# Patient Record
Sex: Female | Born: 1940 | Race: White | Hispanic: No | Smoking: Former smoker
Health system: Southern US, Community
[De-identification: ages and names within clinical notes are randomized; demographics above are authoritative.]

## PROBLEM LIST (undated history)

## (undated) DIAGNOSIS — E7800 Pure hypercholesterolemia, unspecified: Secondary | ICD-10-CM

## (undated) DIAGNOSIS — E039 Hypothyroidism, unspecified: Secondary | ICD-10-CM

## (undated) DIAGNOSIS — N029 Recurrent and persistent hematuria with unspecified morphologic changes: Secondary | ICD-10-CM

## (undated) DIAGNOSIS — M1990 Unspecified osteoarthritis, unspecified site: Secondary | ICD-10-CM

## (undated) DIAGNOSIS — R55 Syncope and collapse: Secondary | ICD-10-CM

## (undated) DIAGNOSIS — I10 Essential (primary) hypertension: Secondary | ICD-10-CM

## (undated) DIAGNOSIS — R011 Cardiac murmur, unspecified: Secondary | ICD-10-CM

## (undated) DIAGNOSIS — K219 Gastro-esophageal reflux disease without esophagitis: Secondary | ICD-10-CM

## (undated) HISTORY — PX: TENDON RELEASE: SHX230

## (undated) HISTORY — DX: Gastro-esophageal reflux disease without esophagitis: K21.9

## (undated) HISTORY — DX: Cardiac murmur, unspecified: R01.1

## (undated) HISTORY — DX: Essential (primary) hypertension: I10

## (undated) HISTORY — DX: Hypothyroidism, unspecified: E03.9

## (undated) HISTORY — DX: Pure hypercholesterolemia, unspecified: E78.00

## (undated) HISTORY — DX: Recurrent and persistent hematuria with unspecified morphologic changes: N02.9

## (undated) HISTORY — DX: Unspecified osteoarthritis, unspecified site: M19.90

## (undated) HISTORY — DX: Syncope and collapse: R55

---
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
## 2000-12-28 ENCOUNTER — Other Ambulatory Visit: Admission: RE | Admit: 2000-12-28 | Discharge: 2000-12-28 | Payer: Self-pay | Admitting: Gynecology

## 2001-10-31 ENCOUNTER — Encounter: Admission: RE | Admit: 2001-10-31 | Discharge: 2001-10-31 | Payer: Self-pay | Admitting: Gynecology

## 2001-10-31 ENCOUNTER — Encounter: Payer: Self-pay | Admitting: Gynecology

## 2001-12-30 ENCOUNTER — Other Ambulatory Visit: Admission: RE | Admit: 2001-12-30 | Discharge: 2001-12-30 | Payer: Self-pay | Admitting: Gynecology

## 2002-01-17 ENCOUNTER — Encounter: Payer: Self-pay | Admitting: Family Medicine

## 2002-01-17 ENCOUNTER — Encounter: Admission: RE | Admit: 2002-01-17 | Discharge: 2002-01-17 | Payer: Self-pay | Admitting: Family Medicine

## 2003-01-13 ENCOUNTER — Other Ambulatory Visit: Admission: RE | Admit: 2003-01-13 | Discharge: 2003-01-13 | Payer: Self-pay | Admitting: Gynecology

## 2004-02-03 ENCOUNTER — Other Ambulatory Visit: Admission: RE | Admit: 2004-02-03 | Discharge: 2004-02-03 | Payer: Self-pay | Admitting: Gynecology

## 2004-03-22 ENCOUNTER — Ambulatory Visit (HOSPITAL_COMMUNITY): Admission: RE | Admit: 2004-03-22 | Discharge: 2004-03-22 | Payer: Self-pay | Admitting: Neurology

## 2004-09-07 ENCOUNTER — Emergency Department (HOSPITAL_COMMUNITY): Admission: EM | Admit: 2004-09-07 | Discharge: 2004-09-07 | Payer: Self-pay | Admitting: Emergency Medicine

## 2005-02-03 ENCOUNTER — Other Ambulatory Visit: Admission: RE | Admit: 2005-02-03 | Discharge: 2005-02-03 | Payer: Self-pay | Admitting: Gynecology

## 2005-04-20 ENCOUNTER — Ambulatory Visit: Payer: Self-pay | Admitting: Internal Medicine

## 2006-02-01 ENCOUNTER — Ambulatory Visit: Payer: Self-pay | Admitting: Internal Medicine

## 2007-05-09 ENCOUNTER — Other Ambulatory Visit: Admission: RE | Admit: 2007-05-09 | Discharge: 2007-05-09 | Payer: Self-pay | Admitting: Gynecology

## 2007-10-16 ENCOUNTER — Emergency Department: Payer: Self-pay | Admitting: Unknown Physician Specialty

## 2009-01-14 ENCOUNTER — Ambulatory Visit: Payer: Self-pay | Admitting: Women's Health

## 2009-01-14 ENCOUNTER — Encounter: Payer: Self-pay | Admitting: Women's Health

## 2009-01-14 ENCOUNTER — Other Ambulatory Visit: Admission: RE | Admit: 2009-01-14 | Discharge: 2009-01-14 | Payer: Self-pay | Admitting: Obstetrics and Gynecology

## 2009-01-27 ENCOUNTER — Encounter: Admission: RE | Admit: 2009-01-27 | Discharge: 2009-01-27 | Payer: Self-pay | Admitting: Gynecology

## 2009-06-28 ENCOUNTER — Ambulatory Visit: Payer: Self-pay | Admitting: Internal Medicine

## 2009-07-08 ENCOUNTER — Ambulatory Visit: Payer: Self-pay | Admitting: Nephrology

## 2009-07-26 ENCOUNTER — Inpatient Hospital Stay: Payer: Self-pay | Admitting: *Deleted

## 2009-12-21 ENCOUNTER — Encounter: Admission: RE | Admit: 2009-12-21 | Discharge: 2009-12-21 | Payer: Self-pay | Admitting: Neurology

## 2010-01-01 ENCOUNTER — Emergency Department: Payer: Self-pay | Admitting: Emergency Medicine

## 2010-11-27 ENCOUNTER — Encounter: Payer: Self-pay | Admitting: Neurology

## 2010-11-28 ENCOUNTER — Ambulatory Visit: Payer: Self-pay | Admitting: Family Medicine

## 2012-05-30 ENCOUNTER — Ambulatory Visit: Payer: Self-pay | Admitting: Family Medicine

## 2013-01-17 ENCOUNTER — Encounter: Payer: Self-pay | Admitting: Internal Medicine

## 2013-01-17 ENCOUNTER — Ambulatory Visit (INDEPENDENT_AMBULATORY_CARE_PROVIDER_SITE_OTHER): Payer: Medicare Other | Admitting: Internal Medicine

## 2013-01-17 VITALS — BP 130/64 | HR 72 | Temp 98.1°F | Ht 62.16 in | Wt 128.0 lb

## 2013-01-17 DIAGNOSIS — E7800 Pure hypercholesterolemia, unspecified: Secondary | ICD-10-CM

## 2013-01-17 DIAGNOSIS — E039 Hypothyroidism, unspecified: Secondary | ICD-10-CM

## 2013-01-17 DIAGNOSIS — N059 Unspecified nephritic syndrome with unspecified morphologic changes: Secondary | ICD-10-CM

## 2013-01-17 DIAGNOSIS — Z1239 Encounter for other screening for malignant neoplasm of breast: Secondary | ICD-10-CM

## 2013-01-17 DIAGNOSIS — K219 Gastro-esophageal reflux disease without esophagitis: Secondary | ICD-10-CM

## 2013-01-17 DIAGNOSIS — N029 Recurrent and persistent hematuria with unspecified morphologic changes: Secondary | ICD-10-CM

## 2013-01-17 DIAGNOSIS — I10 Essential (primary) hypertension: Secondary | ICD-10-CM

## 2013-01-19 ENCOUNTER — Encounter: Payer: Self-pay | Admitting: Internal Medicine

## 2013-01-19 DIAGNOSIS — I10 Essential (primary) hypertension: Secondary | ICD-10-CM | POA: Insufficient documentation

## 2013-01-19 DIAGNOSIS — N029 Recurrent and persistent hematuria with unspecified morphologic changes: Secondary | ICD-10-CM | POA: Insufficient documentation

## 2013-01-19 DIAGNOSIS — E039 Hypothyroidism, unspecified: Secondary | ICD-10-CM | POA: Insufficient documentation

## 2013-01-19 DIAGNOSIS — E7800 Pure hypercholesterolemia, unspecified: Secondary | ICD-10-CM | POA: Insufficient documentation

## 2013-01-19 DIAGNOSIS — K219 Gastro-esophageal reflux disease without esophagitis: Secondary | ICD-10-CM | POA: Insufficient documentation

## 2013-01-19 NOTE — Assessment & Plan Note (Signed)
On lisinopril.  Blood pressure elevated today.  Have her spot check her pressure and record.  Get her back in soon to reassess.  Obtain outside lab results.

## 2013-01-19 NOTE — Assessment & Plan Note (Signed)
Followed by nephrology.  Labs through their office.  Last Cr documented 1.4.  Follow.

## 2013-01-19 NOTE — Progress Notes (Signed)
Subjective:    Patient ID: Kristen Bartlett, female    DOB: [birth year], 72 y.o.   MRN: [id number]  HPI 72 year old female with past history of hypertension, hypercholesterolemia and thin basement membrane disease.  She comes in today to follow up on these issues as well as to establish care.  She has been followed by Dr Alba Cory at [facility].  She is also followed by Dr Gwenith Spitz - for her kidney issue.  She retired in 2006.  Lives in [place].  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  Is followed at [facility].  Has follow up planned next month.  They do her labs.  No acid reflux.  Bowels relatively stable.  Fiber helps to keep them regulated.  No chest pain or tightness.  Breathing stable.     Past Medical History  Diagnosis Date  . Arthritis   . GERD (gastroesophageal reflux disease)   . Hypercholesterolemia   . Hypertension   . Thin basement membrane disease   . Hypothyroidism     Outpatient Encounter Prescriptions as of 01/17/2013  Medication Sig Dispense Refill  . acyclovir (ZOVIRAX) 200 MG capsule Take 200 mg by mouth daily.      Marland Kitchen aspirin 81 MG tablet Take 81 mg by mouth daily.      . Calcium Carb-Cholecalciferol (CALCIUM + D3 PO) Take 600 mg by mouth 2 (two) times daily.      . furosemide (LASIX) 20 MG tablet Take 20 mg by mouth as needed.      Marland Kitchen levothyroxine (SYNTHROID, LEVOTHROID) 50 MCG tablet Take 50 mcg by mouth daily.      Marland Kitchen lisinopril (PRINIVIL,ZESTRIL) 20 MG tablet Take 20 mg by mouth daily.      . Multiple Vitamin (MULTIVITAMIN) tablet Take 1 tablet by mouth daily.      . ranitidine (ZANTAC) 150 MG capsule Take 150 mg by mouth daily.       No facility-administered encounter medications on file as of 01/17/2013.    Review of Systems Patient denies any headache, lightheadedness or dizziness. No significant sinus or allergy issues. i No chest pain, tightness or palpitations.  No increased shortness of breath, cough or congestion. No acid reflux.  No  nausea or vomiting.  No abdominal pain or cramping.  No bowel change, such as diarrhea, constipation, BRBPR or melana.  No urine change.   She has noticed numbness in her right hand.  Uses her hand a lot.  Worse at times.  Had intolerance to cholesterol medication.  Has not had a physical in a long time.  Is followed by nephrology 2x/year.       Objective:   Physical Exam Filed Vitals:   01/17/13 1419  BP: 130/64  Pulse: 72  Temp: 98.1 F (36.7 C)   Blood pressure recheck:  1/49  72 year old female in no acute distress.   HEENT:  Nares- clear.  Oropharynx - without lesions. NECK:  Supple.  Nontender.  No audible bruit.  HEART:  Appears to be regular. LUNGS:  No crackles or wheezing audible.  Respirations even and unlabored.  RADIAL PULSE:  Equal bilaterally.  ABDOMEN:  Soft, nontender.  Bowel sounds present and normal.  No audible abdominal bruit.   EXTREMITIES:  No increased edema present.  DP pulses palpable and equal bilaterally.      MSK:  Grip strength appears to be equal on both sides. NEURO:  Positive tinels and phalens.  Assessment & Plan:  RIGHT HAND NUMBNESS.  Symptoms and exam as outlined.  Positive phalens and tinels.  Will place in a splint.  Follow.  May need referral for further w/up and evaluation.  Discussed NCS with her.  She wants to try the splint first.  Follow.    HEALTH MAINTENANCE.  Schedule a physical next visit.  Schedule a mammogram.  Discussed need for colon cancer screening.  She will notify me when agreeable for referral.

## 2013-01-19 NOTE — Assessment & Plan Note (Signed)
On ranitidine.  As long as she take the medication, symptoms are controlled.  If she skips a couple of days, symptoms return.  Continue ranitidine.  Discussed with her regarding need for EGD (especially given she has never had one and with the immediate return of symptoms with attempts at stopping the ranitidine).  She will notify me if agreeable for referral.

## 2013-01-19 NOTE — Assessment & Plan Note (Signed)
Previously had intolerance to statin medication.  Low cholesterol diet.  Needs a lipid panel.  Labs through nephrology.  Obtain results.

## 2013-01-19 NOTE — Assessment & Plan Note (Signed)
On thyroid replacement.  Follow tsh.  

## 2013-01-23 ENCOUNTER — Telehealth: Payer: Self-pay | Admitting: *Deleted

## 2013-01-23 NOTE — Telephone Encounter (Signed)
The patient called the office stating that her insurance will not pay for her splint prescribed by the doctor unless the script is reworded  to have these words in them.."dynamic splint needed for orthopedic or neurological condition"

## 2013-01-23 NOTE — Telephone Encounter (Signed)
Called patient to let her know prescription is waiting up front for her to pick-up.

## 2013-01-23 NOTE — Telephone Encounter (Signed)
rx written and on your desk

## 2013-02-25 ENCOUNTER — Telehealth: Payer: Self-pay

## 2013-02-25 NOTE — Telephone Encounter (Signed)
Called medical records at the department of radiology in [place] about the f/u views from her mammogram has you asked. The lady said they do not have f/u views at there office they only do reports from the mammograms. She did say they have a cd film, did want me to call them back to see if they could mail that to the office.  Phone # [phone]

## 2013-02-26 ENCOUNTER — Inpatient Hospital Stay: Payer: Self-pay | Admitting: Internal Medicine

## 2013-02-26 LAB — COMPREHENSIVE METABOLIC PANEL
Albumin: 3.8 g/dL (ref 3.4–5.0)
Alkaline Phosphatase: 79 U/L (ref 50–136)
Bilirubin,Total: 0.8 mg/dL (ref 0.2–1.0)
Chloride: 105 mmol/L (ref 98–107)
Co2: 23 mmol/L (ref 21–32)
Glucose: 141 mg/dL — ABNORMAL HIGH (ref 65–99)
Osmolality: 287 (ref 275–301)
SGOT(AST): 26 U/L (ref 15–37)
Sodium: 138 mmol/L (ref 136–145)
Total Protein: 7.4 g/dL (ref 6.4–8.2)

## 2013-02-26 LAB — TROPONIN I: Troponin-I: <0.02 ng/mL

## 2013-02-26 LAB — CBC
MCHC: 33.7 g/dL (ref 32.0–36.0)
Platelet: 273 10*3/uL (ref 150–440)
RBC: 3.96 10*6/uL (ref 3.80–5.20)
RDW: 12.7 % (ref 11.5–14.5)
WBC: 17.5 10*3/uL — ABNORMAL HIGH (ref 3.6–11.0)

## 2013-02-26 LAB — URINALYSIS, COMPLETE
Blood: NEGATIVE
Glucose,UR: NEGATIVE mg/dL (ref 0–75)
Nitrite: NEGATIVE
Ph: 6 (ref 4.5–8.0)
Protein: NEGATIVE
RBC,UR: 1 /HPF (ref 0–5)
Specific Gravity: 1.012 (ref 1.003–1.030)
Squamous Epithelial: NONE SEEN

## 2013-02-26 LAB — DIFFERENTIAL
Eosinophil: 3 %
Lymphocytes: 40 %
Monocytes: 6 %

## 2013-02-26 LAB — CK TOTAL AND CKMB (NOT AT ARMC): CK, Total: 69 U/L (ref 21–215)

## 2013-02-26 NOTE — Telephone Encounter (Signed)
Called [facility] and they have called pt and have now mailed a letter to her - to come in for her follow up views.  Waiting for her to call.

## 2013-02-27 LAB — CBC WITH DIFFERENTIAL/PLATELET
Eosinophil %: 1.5 %
HCT: 33.6 % — ABNORMAL LOW (ref 35.0–47.0)
Lymphocyte #: 2 10*3/uL (ref 1.0–3.6)
MCH: 30.9 pg (ref 26.0–34.0)
MCHC: 33.1 g/dL (ref 32.0–36.0)
MCV: 93 fL (ref 80–100)
Monocyte #: 0.8 x10 3/mm (ref 0.2–0.9)
Monocyte %: 6.5 %
Neutrophil #: 9.4 10*3/uL — ABNORMAL HIGH (ref 1.4–6.5)
Neutrophil %: 75.6 %
Platelet: 197 10*3/uL (ref 150–440)
RBC: 3.6 10*6/uL — ABNORMAL LOW (ref 3.80–5.20)

## 2013-02-27 LAB — MAGNESIUM: Magnesium: 1.6 mg/dL — ABNORMAL LOW

## 2013-02-27 LAB — BASIC METABOLIC PANEL
Calcium, Total: 8.2 mg/dL — ABNORMAL LOW (ref 8.5–10.1)
EGFR (African American): 34 — ABNORMAL LOW
EGFR (Non-African Amer.): 30 — ABNORMAL LOW
Glucose: 94 mg/dL (ref 65–99)
Osmolality: 284 (ref 275–301)
Potassium: 4.1 mmol/L (ref 3.5–5.1)

## 2013-02-28 LAB — URINE CULTURE

## 2013-03-04 ENCOUNTER — Telehealth: Payer: Self-pay | Admitting: Internal Medicine

## 2013-03-04 LAB — CULTURE, BLOOD (SINGLE)

## 2013-03-04 NOTE — Telephone Encounter (Signed)
Patient called in to make a hospital follow up appointment she was discharged from the hospital on 02/27/13.

## 2013-03-06 ENCOUNTER — Telehealth: Payer: Self-pay | Admitting: Internal Medicine

## 2013-03-06 NOTE — Telephone Encounter (Signed)
I called and spoke with patient and advised that Medicare would view that as two visits and that she would be charged so she would like to keep them separate.She did appreciate me calling and explaining this to her.

## 2013-03-06 NOTE — Telephone Encounter (Signed)
This would be viewed by medicare as two separate visits.  Can you explain this to pt (please).  Thanks.

## 2013-03-06 NOTE — Telephone Encounter (Signed)
The patient want to know if she can have her hospital follow done on the same day she has her medicare wellness visit 5.13.14. If she can have both done on the same day she is wanting her hospital appointment time canceled  5.8.14.

## 2013-03-13 ENCOUNTER — Encounter: Payer: Self-pay | Admitting: Internal Medicine

## 2013-03-13 ENCOUNTER — Ambulatory Visit (INDEPENDENT_AMBULATORY_CARE_PROVIDER_SITE_OTHER): Payer: Medicare Other | Admitting: Internal Medicine

## 2013-03-13 VITALS — BP 130/80 | HR 76 | Temp 98.3°F | Ht 62.16 in | Wt 125.5 lb

## 2013-03-13 DIAGNOSIS — E7800 Pure hypercholesterolemia, unspecified: Secondary | ICD-10-CM

## 2013-03-13 DIAGNOSIS — I10 Essential (primary) hypertension: Secondary | ICD-10-CM

## 2013-03-13 DIAGNOSIS — E039 Hypothyroidism, unspecified: Secondary | ICD-10-CM

## 2013-03-13 DIAGNOSIS — K219 Gastro-esophageal reflux disease without esophagitis: Secondary | ICD-10-CM

## 2013-03-13 DIAGNOSIS — R55 Syncope and collapse: Secondary | ICD-10-CM

## 2013-03-13 DIAGNOSIS — N029 Recurrent and persistent hematuria with unspecified morphologic changes: Secondary | ICD-10-CM

## 2013-03-13 DIAGNOSIS — N059 Unspecified nephritic syndrome with unspecified morphologic changes: Secondary | ICD-10-CM

## 2013-03-13 MED ORDER — PANTOPRAZOLE SODIUM 40 MG PO TBEC: 40.0000 mg | DELAYED_RELEASE_TABLET | Freq: Every day | ORAL | Status: DC

## 2013-03-16 ENCOUNTER — Encounter: Payer: Self-pay | Admitting: Internal Medicine

## 2013-03-16 NOTE — Assessment & Plan Note (Signed)
On thyroid replacement.  Follow tsh.  

## 2013-03-16 NOTE — Assessment & Plan Note (Signed)
On lisinopril.  Blood pressure doing well now.  Remain off lasix.  Not orthostatic on exam.  Follow.

## 2013-03-16 NOTE — Progress Notes (Signed)
Subjective:    Patient ID: Kristen Bartlett, female    DOB: 05/16/1941, 72 y.o.   MRN: [id number]  HPI 72 year old female with past history of hypertension, hypercholesterolemia and thin basement membrane disease.  She comes in today for a hospital follow up.  She was admitted on 02/26/13 for what appeared to be a syncopal or near syncopal episode that occurred while she was at [facility].  See the admission H&P for details.  She informed me that approximately one to two weeks before this episode, she was having trouble with her hands and feet swelling.  She started taking lasix everyday. Reports does not drink a lot of water.  Had been standing at [facility] for a while when the episode occurred.  She was taken to the ER and blood pressure was low and Cr and wbc count was elevated. Her temperature was low.  She was admitted and hydrated.  Was treated with abx, however no source of infection was found.  Prior to discharge, her kidney function and white count were improving.  Since her discharge, she has not had any further episodes like this.  She does report, that in the past, she has felt a strange feeling intermittently.  Never to this extent.  She states she can usually drink water and the feeling will improve.   She is also followed by Dr Gwenith Spitz - for her kidney issue.  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  Is followed at [facility].  They have been drawing her labs.  Cr usually around 1.5.  Some acid reflux.  Had been on nexium previously.   Bowels relatively stable.  Fiber helps to keep them regulated.  No chest pain or tightness.  Breathing stable.     Past Medical History  Diagnosis Date  . Arthritis   . GERD (gastroesophageal reflux disease)   . Hypercholesterolemia   . Hypertension   . Thin basement membrane disease   . Hypothyroidism     Outpatient Encounter Prescriptions as of 03/13/2013  Medication Sig Dispense Refill  . acyclovir (ZOVIRAX) 200 MG capsule Take 200 mg by  mouth daily.      Marland Kitchen aspirin 81 MG tablet Take 81 mg by mouth daily.      . Calcium Carb-Cholecalciferol (CALCIUM + D3 PO) Take 600 mg by mouth 2 (two) times daily.      . furosemide (LASIX) 20 MG tablet Take 20 mg by mouth as needed.      Marland Kitchen levothyroxine (SYNTHROID, LEVOTHROID) 50 MCG tablet Take 50 mcg by mouth daily.      Marland Kitchen lisinopril (PRINIVIL,ZESTRIL) 20 MG tablet Take 20 mg by mouth daily.      . Multiple Vitamin (MULTIVITAMIN) tablet Take 1 tablet by mouth daily.      . pantoprazole (PROTONIX) 40 MG tablet Take 1 tablet (40 mg total) by mouth daily.  30 tablet  1  . ranitidine (ZANTAC) 150 MG capsule Take 150 mg by mouth daily.       No facility-administered encounter medications on file as of 03/13/2013.    Review of Systems Patient denies any headache, lightheadedness or dizziness now.  Had the syncopal or near syncopal episode as outlined.  No significant sinus or allergy issues.   No chest pain, tightness or palpitations.  No increased shortness of breath, cough or congestion.  No nausea or vomiting.  No abdominal pain or cramping.  No bowel change, such as diarrhea, constipation, BRBPR or melana.  No  urine change  No longer taking lasix.  We discussed this at length.  She had been taking it daily.  Feel that she was volume depleted.  Kidney function improved with hydration.  With intermittent episodes as outlined.  Not to this extent.        Objective:   Physical Exam  Filed Vitals:   03/13/13 1130  BP: 130/80  Pulse: 76  Temp: 98.3 F (36.8 C)   Not orthostatic on exam.    72 year old female in no acute distress.   HEENT:  Nares- clear.  Oropharynx - without lesions. NECK:  Supple.  Nontender.  No audible bruit.  HEART:  Appears to be regular. LUNGS:  No crackles or wheezing audible.  Respirations even and unlabored.  RADIAL PULSE:  Equal bilaterally.  ABDOMEN:  Soft, nontender.  Bowel sounds present and normal.  No audible abdominal bruit.   EXTREMITIES:  No increased  edema present.  DP pulses palpable and equal bilaterally.     Assessment & Plan:  SYNCOPE/NEAR SYNCOPE.  Had the recent episode as outlined.  Blood pressure low.  Kidney function increased.  Responded to hydration.  Is off lasix now.  Feel was volume depleted.  Does have the intermittent episodes as outlined.  Unclear as to the exact etiology of these reoccurring episodes.  Will check a carotid ultrasound.  Also, have cardiology evaluate to confirm no cardiac origin.   HYPOMAG.  Will recheck magnesium with next labs.   LEUKOCYTOSIS.  No evidence of infection found while in the hospital.  May have been related to an acute stress response and dehydration.  Will recheck with next labs to confirm normal.      HEALTH MAINTENANCE.  Scheduled for a physical next visit.

## 2013-03-16 NOTE — Assessment & Plan Note (Signed)
Previously had intolerance to statin medication.  Low cholesterol diet.  Needs a lipid panel.  Labs through nephrology.  Obtain results.

## 2013-03-16 NOTE — Assessment & Plan Note (Signed)
Followed by nephrology.  Labs through their office.  Last Cr documented 1.4.  Follow.  Renal function improving at her discharge.  Will recheck with next labs.  Remain off lasix.

## 2013-03-16 NOTE — Assessment & Plan Note (Signed)
On ranitidine.  Will change to protonix 40mg  q day.  Have discussed with her regarding need for EGD (especially given she has never had one and with the immediate return of symptoms with attempts at stopping the ranitidine).

## 2013-03-17 ENCOUNTER — Encounter: Payer: Self-pay | Admitting: Emergency Medicine

## 2013-03-18 ENCOUNTER — Encounter: Payer: Self-pay | Admitting: Internal Medicine

## 2013-03-18 ENCOUNTER — Ambulatory Visit (INDEPENDENT_AMBULATORY_CARE_PROVIDER_SITE_OTHER): Payer: Medicare Other | Admitting: Internal Medicine

## 2013-03-18 DIAGNOSIS — D72829 Elevated white blood cell count, unspecified: Secondary | ICD-10-CM

## 2013-03-18 DIAGNOSIS — N289 Disorder of kidney and ureter, unspecified: Secondary | ICD-10-CM

## 2013-03-18 DIAGNOSIS — N029 Recurrent and persistent hematuria with unspecified morphologic changes: Secondary | ICD-10-CM

## 2013-03-18 DIAGNOSIS — E039 Hypothyroidism, unspecified: Secondary | ICD-10-CM

## 2013-03-18 DIAGNOSIS — K219 Gastro-esophageal reflux disease without esophagitis: Secondary | ICD-10-CM

## 2013-03-18 DIAGNOSIS — N059 Unspecified nephritic syndrome with unspecified morphologic changes: Secondary | ICD-10-CM

## 2013-03-18 DIAGNOSIS — I10 Essential (primary) hypertension: Secondary | ICD-10-CM

## 2013-03-18 DIAGNOSIS — E7800 Pure hypercholesterolemia, unspecified: Secondary | ICD-10-CM

## 2013-03-18 LAB — CBC WITH DIFFERENTIAL/PLATELET
Basophils Relative: 0.7 % (ref 0.0–3.0)
Eosinophils Relative: 6 % — ABNORMAL HIGH (ref 0.0–5.0)
Hemoglobin: 11.6 g/dL — ABNORMAL LOW (ref 12.0–15.0)
Monocytes Absolute: 0.6 10*3/uL (ref 0.1–1.0)
Monocytes Relative: 7 % (ref 3.0–12.0)
Neutro Abs: 5 10*3/uL (ref 1.4–7.7)
Platelets: 244 10*3/uL (ref 150.0–400.0)
RBC: 3.66 Mil/uL — ABNORMAL LOW (ref 3.87–5.11)
WBC: 8.5 10*3/uL (ref 4.5–10.5)

## 2013-03-18 LAB — BASIC METABOLIC PANEL
Creatinine, Ser: 1.5 mg/dL — ABNORMAL HIGH (ref 0.4–1.2)
Glucose, Bld: 84 mg/dL (ref 70–99)
Potassium: 4.7 mEq/L (ref 3.5–5.1)

## 2013-03-18 NOTE — Progress Notes (Signed)
Subjective:    Patient ID: Kristen Bartlett, female    DOB: November 28, 1940, 72 y.o.   MRN: [id number]  HPI 72 year old female with past history of hypertension, hypercholesterolemia and thin basement membrane disease.  She comes in today to follow up on these issues as well as for a complete physical exam.   She was admitted on 02/26/13 for what appeared to be a syncopal or near syncopal episode that occurred while she was at [facility].  See the admission H&P for details.  She informed me that approximately one to two weeks before this episode, she was having trouble with her hands and feet swelling.  She started taking lasix everyday.  She was taken to the ER and blood pressure was low and Cr and wbc count was elevated. Her temperature was low.  She was admitted and hydrated.  Was treated with abx, however no source of infection was found.  Prior to discharge, her kidney function and white count were improving.  Since her discharge, she has not had any further episodes like this.  She does report, that in the past, she has felt a strange feeling intermittently.  Never to this extent.  She states she can usually drink water and the feeling will improve.  We discussed this at length last visit.  Given the intermittent episodes of not feeling right, near syncope, etc - it was decided to refer her to cardiology for evaluation and to confirm no cardiac source (ie arrhythmia, etc) for the episodes.  She is also scheduled for a carotid ultrasound at the end of this week.  She does inform me that on 03/15/13, she was lying in bed.  Noticed things appeared to be moving - right to left.  No dizziness or significant light headedness.  No headache.  No other neurological changes.  Lasted approximately 10 minutes.  Resolved.  No reoccurring episodes.    She is also followed by Dr Gwenith Spitz - for her kidney issue.  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  Is followed at [facility].  They have been drawing her labs.  Cr  usually around 1.5.  Some acid reflux reported last visit.  Had been on nexium previously.   Started protonix last visit.  Doing better.  Acid not a problem unless she eats late.  Bowels relatively stable.  Fiber helps to keep them regulated.  No chest pain or tightness.  Breathing stable.     Past Medical History  Diagnosis Date  . Arthritis   . GERD (gastroesophageal reflux disease)   . Hypercholesterolemia   . Hypertension   . Thin basement membrane disease   . Hypothyroidism     Outpatient Encounter Prescriptions as of 03/18/2013  Medication Sig Dispense Refill  . acyclovir (ZOVIRAX) 200 MG capsule Take 200 mg by mouth daily.      Marland Kitchen aspirin 81 MG tablet Take 81 mg by mouth daily.      . Calcium Carb-Cholecalciferol (CALCIUM + D3 PO) Take 600 mg by mouth 2 (two) times daily.      . furosemide (LASIX) 20 MG tablet Take 20 mg by mouth as needed.      Marland Kitchen levothyroxine (SYNTHROID, LEVOTHROID) 50 MCG tablet Take 50 mcg by mouth daily.      Marland Kitchen lisinopril (PRINIVIL,ZESTRIL) 20 MG tablet Take 20 mg by mouth daily.      . Multiple Vitamin (MULTIVITAMIN) tablet Take 1 tablet by mouth daily.      . pantoprazole (  PROTONIX) 40 MG tablet Take 1 tablet (40 mg total) by mouth daily.  30 tablet  1  . ranitidine (ZANTAC) 150 MG capsule Take 150 mg by mouth daily.       No facility-administered encounter medications on file as of 03/18/2013.    Review of Systems Patient denies any headache, lightheadedness or dizziness now.  Had the syncopal or near syncopal episode as outlined.  No significant sinus or allergy issues.   No chest pain, tightness or palpitations.  No increased shortness of breath, cough or congestion.  No nausea or vomiting.  No abdominal pain or cramping.  No bowel change, such as diarrhea, constipation, BRBPR or melana.  No urine change  No longer taking lasix.  We discussed this at length.  She had been taking it daily.  Feel that she was volume depleted.  Kidney function improved with  hydration.  With intermittent episodes as outlined.  Not to this extent.   She did see Dr Deeann Saint.  Had NCS.  Diagnosed with CTS.  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  Helping.  Does report some increased leg cramps.      Objective:   Physical Exam  Filed Vitals:   03/18/13 1335  BP: 120/80  Pulse: 87  Temp: 99 F (37.2 C)   Blood pressure recheck:  132/68, pulse 24  72 year old female in no acute distress.   HEENT:  Nares- clear.  Oropharynx - without lesions. NECK:  Supple.  Nontender.  No audible bruit.  HEART:  Appears to be regular. LUNGS:  No crackles or wheezing audible.  Respirations even and unlabored.  RADIAL PULSE:  Equal bilaterally.    BREASTS:  No nipple discharge or nipple retraction present.  Could not appreciate any distinct nodules or axillary adenopathy.  ABDOMEN:  Soft, nontender.  Bowel sounds present and normal.  No audible abdominal bruit.  GU:  She declined.    RECTAL:  She declined.  EXTREMITIES:  No increased edema present.  DP pulses palpable and equal bilaterally.          Assessment & Plan:  SYNCOPE/NEAR SYNCOPE.  Had the recent episode as outlined.  Blood pressure low.  Kidney function increased.  Responded to hydration.  Is off lasix now.  Feel was volume depleted.  Does have the intermittent episodes as outlined.  Unclear as to the exact etiology of these reoccurring episodes.  Occurs when she is not on lasix, etc.   Will check a carotid ultrasound.  Also, have cardiology evaluate to confirm no cardiac origin.  Has appt for carotid ultrasound this week.  Appt with cardiology in the near future.    VISUAL DISTURBANCE. Unclear as to the exact etiology.  No vision loss.  No headache.  We discussed further w/up and evaluation.  Does have a carotid US scheduled.  Declines head scan, neurology referral. Wants to hold on any further w/up.  Follow.  Continue daily aspirin.    HYPOMAG.  Will recheck magnesium level today.  Having leg cramps.  Stay hydrated.    LEUKOCYTOSIS.  No evidence of infection found while in the hospital.  May have been related to an acute stress response and dehydration.  Will recheck today to confirm normal.      HEALTH MAINTENANCE.  Physical today. She declined pelvic exam.  States had mammogram.   Obtain results.  Discussed colonoscopy.  Need to sort through above issues first.

## 2013-03-19 ENCOUNTER — Ambulatory Visit: Payer: Medicare Other

## 2013-03-19 ENCOUNTER — Encounter: Payer: Self-pay | Admitting: Internal Medicine

## 2013-03-19 DIAGNOSIS — D649 Anemia, unspecified: Secondary | ICD-10-CM

## 2013-03-19 LAB — FERRITIN: Ferritin: 34.2 ng/mL (ref 10.0–291.0)

## 2013-03-19 LAB — IBC PANEL
Iron: 51 ug/dL (ref 42–145)
Saturation Ratios: 15 % — ABNORMAL LOW (ref 20.0–50.0)
Transferrin: 243.4 mg/dL (ref 212.0–360.0)

## 2013-03-19 NOTE — Assessment & Plan Note (Signed)
On protonix 40mg  q day.  Have discussed with her regarding need for EGD (especially given she has never had one and with the immediate return of symptoms with attempts at stopping the ranitidine).  Sort through her current issues first.  Doing better.

## 2013-03-19 NOTE — Assessment & Plan Note (Signed)
On lisinopril.  Blood pressure doing well now.  Remain off lasix.  Follow.  Check metabolic panel.

## 2013-03-19 NOTE — Assessment & Plan Note (Signed)
On thyroid replacement.  Follow tsh.  

## 2013-03-19 NOTE — Assessment & Plan Note (Signed)
Previously had intolerance to statin medication.  Low cholesterol diet.  Needs a lipid panel.  Labs through nephrology.   

## 2013-03-19 NOTE — Assessment & Plan Note (Signed)
Followed by nephrology.  Labs through their office.  Cr  Usually -1.4 - 1.5.  Follow.  Renal function improving at her discharge.  Will recheck today.   Remain off lasix.

## 2013-03-20 ENCOUNTER — Encounter: Payer: Self-pay | Admitting: Internal Medicine

## 2013-03-20 ENCOUNTER — Telehealth: Payer: Self-pay | Admitting: Internal Medicine

## 2013-03-20 DIAGNOSIS — D649 Anemia, unspecified: Secondary | ICD-10-CM

## 2013-03-20 NOTE — Telephone Encounter (Signed)
Pt notified of lab result and the need for a f/u lab in one month.  Please schedule a non fasting lab appt in one month.  Also, please schedule her for a f/u appt with me in two months.  (needs to be at the end of a 1/2 day).  Please call pt with appt dates and time.  Thanks.

## 2013-03-21 NOTE — Telephone Encounter (Signed)
Spoke with pt & informed her of the reasons for repeating her lab work & scheduling a f/u

## 2013-03-21 NOTE — Telephone Encounter (Signed)
Pt will call back to schedule.  For non fasting labs in month and 2 month follow up.  Pt wanted to know what her labs results were and why she needed to come back in a month.  (FYI) her results are on my chart.  Pt also wanted to remind dr scott she had an appointment with her kidney dr in a week

## 2013-03-24 ENCOUNTER — Telehealth: Payer: Self-pay | Admitting: Internal Medicine

## 2013-03-24 NOTE — Telephone Encounter (Signed)
Pt called wanting her last lab results send to dr Gwenith Spitz @ [facility]  Her phone# [phone] ext [phone]   Fax # [phone] Faxed labs 5/19  Pt stated she had appointment 03/28/13

## 2013-03-26 ENCOUNTER — Encounter: Payer: Self-pay | Admitting: *Deleted

## 2013-03-26 ENCOUNTER — Emergency Department: Payer: Self-pay

## 2013-03-26 ENCOUNTER — Telehealth: Payer: Self-pay | Admitting: Internal Medicine

## 2013-03-26 DIAGNOSIS — M25511 Pain in right shoulder: Secondary | ICD-10-CM

## 2013-03-26 LAB — CBC
MCHC: 33.4 g/dL (ref 32.0–36.0)
MCV: 92 fL (ref 80–100)
Platelet: 253 10*3/uL (ref 150–440)
RBC: 3.71 10*6/uL — ABNORMAL LOW (ref 3.80–5.20)
WBC: 15.8 10*3/uL — ABNORMAL HIGH (ref 3.6–11.0)

## 2013-03-26 LAB — COMPREHENSIVE METABOLIC PANEL
Albumin: 3.3 g/dL — ABNORMAL LOW (ref 3.4–5.0)
Alkaline Phosphatase: 79 U/L (ref 50–136)
Anion Gap: 4 — ABNORMAL LOW (ref 7–16)
Bilirubin,Total: 0.4 mg/dL (ref 0.2–1.0)
Calcium, Total: 9.1 mg/dL (ref 8.5–10.1)
Creatinine: 1.71 mg/dL — ABNORMAL HIGH (ref 0.60–1.30)
EGFR (African American): 34 — ABNORMAL LOW
EGFR (Non-African Amer.): 29 — ABNORMAL LOW
Glucose: 129 mg/dL — ABNORMAL HIGH (ref 65–99)

## 2013-03-26 LAB — URINALYSIS, COMPLETE
Bilirubin,UR: NEGATIVE
Hyaline Cast: 1
Ketone: NEGATIVE
Leukocyte Esterase: NEGATIVE
Protein: NEGATIVE
RBC,UR: 2 /HPF (ref 0–5)
Specific Gravity: 1.017 (ref 1.003–1.030)
Squamous Epithelial: NONE SEEN
WBC UR: <1 /HPF (ref 0–5)

## 2013-03-26 LAB — MAGNESIUM: Magnesium: 1.5 mg/dL — ABNORMAL LOW

## 2013-03-26 LAB — TROPONIN I: Troponin-I: <0.02 ng/mL

## 2013-03-26 NOTE — Telephone Encounter (Signed)
noted 

## 2013-03-26 NOTE — Telephone Encounter (Signed)
Kristen Bartlett calling for two reasons; said she received a call today, but did not get a msg; found note that she had been called about her Carotid U/S and it was normal; also wanted to let Dr.Scott know that she passed out again and "cracked" her skull this time; said paramedics came to her home and recommended that she get stitches; said she couldn't afford to use the ambulance, but does have a friend that is taking her to the hosp; wanted to let office know that she would be home all day tomorrow doing a urine study and has appt 5/23 at Nephrology at [facility]; didn't know if Dr.Scott would want to see her again

## 2013-03-26 NOTE — Telephone Encounter (Signed)
Pt at [facility] right now, the nurse was currently getting her vitals and history. I asked pt to call me back tomorrow with an update & to schedule a f/u. And I will call and request her records from ER visit tomorrow also

## 2013-03-26 NOTE — Telephone Encounter (Signed)
I agree with hospital evaluation now and I will need to f/u with her after their evaluation.  Will need to get any information from the hospital - from w/up they do this evening.  I will not be in the office tomorrow.  Does need to go now for evaluation given head injury after syncopal episode.

## 2013-03-27 ENCOUNTER — Ambulatory Visit (INDEPENDENT_AMBULATORY_CARE_PROVIDER_SITE_OTHER): Payer: Medicare Other | Admitting: Cardiovascular Disease

## 2013-03-27 ENCOUNTER — Encounter: Payer: Self-pay | Admitting: Cardiovascular Disease

## 2013-03-27 VITALS — BP 156/78 | HR 68 | Ht 62.5 in | Wt 126.5 lb

## 2013-03-27 DIAGNOSIS — I10 Essential (primary) hypertension: Secondary | ICD-10-CM

## 2013-03-27 DIAGNOSIS — N029 Recurrent and persistent hematuria with unspecified morphologic changes: Secondary | ICD-10-CM

## 2013-03-27 DIAGNOSIS — N059 Unspecified nephritic syndrome with unspecified morphologic changes: Secondary | ICD-10-CM

## 2013-03-27 DIAGNOSIS — R55 Syncope and collapse: Secondary | ICD-10-CM

## 2013-03-27 MED ORDER — FLUDROCORTISONE ACETATE 0.1 MG PO TABS: 0.1000 mg | ORAL_TABLET | Freq: Every day | ORAL | Status: DC

## 2013-03-27 NOTE — Telephone Encounter (Signed)
Pt called this morning to report that she was stitched at the ER (head laceration), & refused to be admitted. She is currently experiencing shoulder pain from the fall during the syncope episode yesterday. The ER also feels that the Syncope episode is heart related & would like for her Cardiology appt to be moved up. I called Dr. Mariah Milling office this am & they are going to see her today at 3pm (will fax records). Pt also wanted to know if you think a Ortho referral would be appropriate for her current shoulder issues & she needs a earlier follow-up with you per ER.

## 2013-03-27 NOTE — Assessment & Plan Note (Addendum)
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 By her of knowledge minute, she does not drink much apart from some coffee, occasional ice tea. We have stressed to her the importance of better fluid hydration in an effort to avoid these episodes. We have recommended if she has malaise, that she sit down or lie down until symptoms resolve and drink fluids.   Have asked her to monitor her blood pressure before and after eating as most of her episodes seem to occur in a postprandial setting. This may exacerbate symptoms in someone prone to drops in her blood pressure. I suggested she avoid hot showers as she did have an episode in February while in the shower. Alcohol in moderation.  We did mention using various medications for blood pressure support such as Florinef or midodrine. We'll not start these medications given her elevated blood pressure today. If blood pressure in the next week or so significantly improves, possibly runs low, Florinef may be a good option. If she has postprandial drop in her blood pressure, midodrine would be an option.   He did mention that she has not had echocardiogram or Holter. She would like to check the Price of these 2 tests as she is living on a fixed income. She would also check the Price of midodrine and Florinef in case these are needed in the future.  We'll see her back in several weeks' time.  More than 40 minutes was spent discussing her history and possible treatment options. Records and lab work were reviewed from recent hospital admission in April 2014, recent blood work and prior testing (including prior stress testing). Various testing available through our office was discussed.

## 2013-03-27 NOTE — Assessment & Plan Note (Signed)
By her report, baseline creatinine 1.5. Recent creatinine was elevated BUN in the past several months including April and May. Encouraged fluid intake.

## 2013-03-27 NOTE — Patient Instructions (Addendum)
  Please increase your fluid intake (walk with water when leaving the house) Please monitor your blood pressure daily, before and after meals If your blood pressure runs low, we will start florinef   Ideally, we would like to order an echocardiogram (ultrsound of the heart) and a 2 day holter monitor  Please call us if you have new issues that need to be addressed before your next appt.  Your physician wants you to follow-up in: 1 month.

## 2013-03-27 NOTE — Progress Notes (Signed)
Patient ID: Kristen Bartlett, female    DOB: 09-Dec-1940, 72 y.o.   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 Lorin Picket for syncope.  Kristen Bartlett has a long history of near syncope. She reports episodes of either near syncope or syncope while in the shower in February 2014. This may have occurred after a big lunch and a hot shower. Another episode she fell in the street. Details are uncertain .She reports having less than 10 episodes, typically associated after food, often out of the house when doing errands, often while in the standing position.  2 episodes of syncope one in 02/26/2013, most recent episode may 21st 2014. The episode in April, she had a large lunch, was standing at [facility] for 40 minutes taking copies. She had malaise of uncertain etiology and then had syncope. EMTs arrived and blood pressure was 90/30. Workup in the hospital was limited as no significant cardiac workup was done. Creatinine was elevated 1.86 it was felt her symptoms were from dehydration. Magnesium is 1.5.   Most recent episode of syncope may 21st 2014 she had been out at [employer] following a large lunch, found a tree service company who followed her back to her house to give her a "on cutting her trees. She had malaise or walking around the yard went into the house and had syncope. She hit the right side of her head. She feels she was down for at least 20 minutes up to 40 minutes. Blood pressure was 98/58, heart rate 86  and evaluated by EMTs . She did not go to the hospital for further evaluation. Recent blood work shows creatinine 1.7, elevated BUN greater than 35.  No history of arrhythmia or palpitations. Admission in April 2014 did not comment on arrhythmia while on telemetry.  Blood pressure the office today is very elevated. She does report blood pressure at home is typically in the  120 range. On each of these near syncope or syncope episodes, blood pressure has been very low   Reports having carotid ultrasound at [facility]. She was told that it was normal  EKG shows normal sinus rhythm with rate 71 beats per minute with no significant ST or T wave changes   Outpatient Encounter Prescriptions as of 03/27/2013  Medication Sig Dispense Refill  . acyclovir (ZOVIRAX) 200 MG capsule Take 200 mg by mouth daily.      Marland Kitchen aspirin 81 MG tablet Take 81 mg by mouth daily.      . Calcium Carb-Cholecalciferol (CALCIUM + D3 PO) Take 600 mg by mouth 2 (two) times daily.      Marland Kitchen gabapentin (NEURONTIN) 300 MG capsule Take 300 mg by mouth at bedtime.      Marland Kitchen levothyroxine (SYNTHROID, LEVOTHROID) 50 MCG tablet Take 50 mcg by mouth daily.      Marland Kitchen lisinopril (PRINIVIL,ZESTRIL) 20 MG tablet Take 20 mg by mouth daily.      . Multiple Vitamin (MULTIVITAMIN) tablet Take 1 tablet by mouth daily.      . ranitidine (ZANTAC) 150 MG capsule Take 150 mg by mouth daily.        Review of Systems  Constitutional: Negative.        Episodes of malaise followed by syncope  HENT: Negative.   Eyes: Negative.   Respiratory: Negative.   Cardiovascular: Negative.   Gastrointestinal: Negative.   Musculoskeletal: Negative.  Skin: Negative.   Neurological: Positive for syncope.  Psychiatric/Behavioral: Negative.   All other systems reviewed and are negative.    BP 156/78  Pulse 68  Ht 5' 2.5" (1.588 m)  Wt 126 lb 8 oz (57.38 kg)  BMI 22.75 kg/m2  LMP 01/18/1984   Physical Exam  Nursing note and vitals reviewed. Constitutional: She is oriented to person, place, and time. She appears well-developed and well-nourished.  HENT:  Head: Normocephalic.  Nose: Nose normal.  Mouth/Throat: Oropharynx is clear and moist.  Eyes: Conjunctivae are normal. Pupils are equal, round, and reactive to light.  Neck: Normal range of motion. Neck supple. No JVD present.  Cardiovascular: Normal  rate, regular rhythm, S1 normal, S2 normal, normal heart sounds and intact distal pulses.  Exam reveals no gallop and no friction rub.   No murmur heard. Pulmonary/Chest: Effort normal and breath sounds normal. No respiratory distress. She has no wheezes. She has no rales. She exhibits no tenderness.  Abdominal: Soft. Bowel sounds are normal. She exhibits no distension. There is no tenderness.  Musculoskeletal: Normal range of motion. She exhibits no edema and no tenderness.  Lymphadenopathy:    She has no cervical adenopathy.  Neurological: She is alert and oriented to person, place, and time. Coordination normal.  Skin: Skin is warm and dry. No rash noted. No erythema.  Psychiatric: She has a normal mood and affect. Her behavior is normal. Judgment and thought content normal.    Assessment and Plan

## 2013-03-27 NOTE — Assessment & Plan Note (Signed)
Blood pressure was elevated today, upwards of 160 systolic. She appears very anxious, recent head trauma. She will monitor her blood pressures this week and next week.

## 2013-03-28 NOTE — Telephone Encounter (Signed)
I placed the order for referral to ortho for evaluation of shoulder after injury.  Regarding an earlier f/u - you can see if she can come in on 04/01/13 (10:00) - block spot and block the 9:45 appt slot.   Thanks.

## 2013-03-29 ENCOUNTER — Other Ambulatory Visit: Payer: Self-pay | Admitting: Internal Medicine

## 2013-03-29 ENCOUNTER — Telehealth: Payer: Self-pay | Admitting: Internal Medicine

## 2013-03-29 DIAGNOSIS — Z139 Encounter for screening, unspecified: Secondary | ICD-10-CM

## 2013-03-29 NOTE — Progress Notes (Signed)
Order placed for varicella titer 

## 2013-03-29 NOTE — Telephone Encounter (Signed)
Thayer Callas to pt regarding f/u appt 04/01/13.  She did see Dr Mariah Milling on 03/27/13 and Dr Hyacinth Meeker on 03/28/13.  Dr Mariah Milling has her monitoring her blood pressure, etc.  No fracture.  Contusion - shoulder.  Does not feel she needs ER f/u at this time - since was evaluated by them on 03/27/13 and 03/28/13.  Cost is an issue for her.  She can get her sutures out at no charge at the ER.  Prefers to hold on f/u now and will call me if she feels she needs anything.  Will keep scheduled f/u appt.

## 2013-04-02 ENCOUNTER — Emergency Department: Payer: Self-pay | Admitting: Emergency Medicine

## 2013-04-09 ENCOUNTER — Encounter: Payer: Self-pay | Admitting: Internal Medicine

## 2013-04-10 ENCOUNTER — Ambulatory Visit: Payer: Medicare Other | Admitting: Cardiovascular Disease

## 2013-04-14 ENCOUNTER — Telehealth: Payer: Self-pay | Admitting: Internal Medicine

## 2013-04-14 NOTE — Telephone Encounter (Signed)
[facility] Dr Gwenith Spitz Was going to sent labs results  She had this done 04/11/13

## 2013-04-15 ENCOUNTER — Telehealth: Payer: Self-pay

## 2013-04-15 NOTE — Telephone Encounter (Signed)
Pt dropped off BP readings They range from: 117/60-161/68, HR=60-86 BPM She writes that insurance will pay for florinef and midodrine and asks if she needs these meds now or if she should wait until appt with you 6/24 Please advise

## 2013-04-15 NOTE — Telephone Encounter (Signed)
Lets try midodrine 2.5 mg  One hour before breakfast and lunch Needs to stay WELL hydrated,  Closely monitor BP We will need to back off on midodrine if she has high blood pressures on consistent basis.   Before she starts, needs blood pressures before eating and after  We can slowly increase midodrine dose if needed to 5 mg if drops seen

## 2013-04-15 NOTE — Telephone Encounter (Signed)
Will place detailed BP log on your desk for review (it contains BPs r/t eating)

## 2013-04-16 NOTE — Telephone Encounter (Signed)
I looked at the blood pressures and they are actually not bad There are no dramatic drops after eating Lowest blood pressure typically 117 systolic, commonly 120s to 130s, occasionally 150 and 160 Would probably not do Florinef as this may cause all blood pressures to go up 24 hours a day   Options include continued aggressive fluid hydration and close monitoring of her blood pressure Would probably try midodrine 2.5 mg one hour before meal if she would like to take anything at all for blood pressure support  Monitor blood pressures to make sure they do not get high and would avoid lying down while she is on midodrine  Medication last for 6 hours.

## 2013-04-17 NOTE — Telephone Encounter (Signed)
Pt informed She would like to continue to monitor BP and stay well hydrated until she sees Dr. Mariah Milling again 6/24

## 2013-04-21 ENCOUNTER — Ambulatory Visit (INDEPENDENT_AMBULATORY_CARE_PROVIDER_SITE_OTHER): Payer: Medicare Other | Admitting: Internal Medicine

## 2013-04-21 VITALS — BP 110/80 | HR 77 | Temp 98.7°F | Ht 62.5 in | Wt 124.0 lb

## 2013-04-21 DIAGNOSIS — Z139 Encounter for screening, unspecified: Secondary | ICD-10-CM

## 2013-04-21 DIAGNOSIS — I10 Essential (primary) hypertension: Secondary | ICD-10-CM

## 2013-04-21 DIAGNOSIS — N029 Recurrent and persistent hematuria with unspecified morphologic changes: Secondary | ICD-10-CM

## 2013-04-21 DIAGNOSIS — R55 Syncope and collapse: Secondary | ICD-10-CM

## 2013-04-21 DIAGNOSIS — D649 Anemia, unspecified: Secondary | ICD-10-CM

## 2013-04-21 DIAGNOSIS — N059 Unspecified nephritic syndrome with unspecified morphologic changes: Secondary | ICD-10-CM

## 2013-04-21 DIAGNOSIS — K219 Gastro-esophageal reflux disease without esophagitis: Secondary | ICD-10-CM

## 2013-04-21 LAB — CBC WITH DIFFERENTIAL/PLATELET
Basophils Absolute: 0.1 10*3/uL (ref 0.0–0.1)
Basophils Relative: 0.7 % (ref 0.0–3.0)
Eosinophils Relative: 1.8 % (ref 0.0–5.0)
HCT: 36.3 % (ref 36.0–46.0)
Hemoglobin: 12.2 g/dL (ref 12.0–15.0)
MCHC: 33.7 g/dL (ref 30.0–36.0)
MCV: 94.4 fl (ref 78.0–100.0)
Neutrophils Relative %: 68.5 % (ref 43.0–77.0)
Platelets: 251 10*3/uL (ref 150.0–400.0)

## 2013-04-21 NOTE — Telephone Encounter (Signed)
Records were received, but not sure if they were from [facility]

## 2013-04-22 ENCOUNTER — Encounter: Payer: Self-pay | Admitting: Internal Medicine

## 2013-04-22 NOTE — Assessment & Plan Note (Addendum)
She has been monitoring her blood pressure as outlined.  Drops as outlined.  Planning to f/u with Dr Mariah Milling at the end of this month.  Per pt, planning to start Midodrine.  Stay hydrated.  Follow.  She is checking with her insurance regarding ECHO and holter monitor.

## 2013-04-22 NOTE — Assessment & Plan Note (Signed)
Followed by nephrology.  Labs through their office.  Cr  Usually -1.4 - 1.5.  Follow.   Remain off lasix.

## 2013-04-22 NOTE — Assessment & Plan Note (Signed)
Off protonix.  Pt prefers not to take.  On Zantac now.  Symptoms controlled.  Follow.   

## 2013-04-22 NOTE — Assessment & Plan Note (Signed)
On lisinopril.  Blood pressure as outlined.  Follow.

## 2013-04-22 NOTE — Progress Notes (Signed)
Subjective:    Patient ID: Kristen Bartlett, female    DOB: 06-Jan-1941, 72 y.o.   MRN: [id number]  HPI 72 year old female with past history of hypertension, hypercholesterolemia and thin basement membrane disease.  She comes in today to follow up on these issues as well as for a complete physical exam.   She was admitted on 02/26/13 for what appeared to be a syncopal or near syncopal episode that occurred while she was at [facility].  See the admission H&P and previous notes for details.  She was admitted and hydrated.  Was treated with abx, however no source of infection was found.  She recently had another episode.  Passed out and injured her shoulder.  Saw Dr Deeann Saint.  Was placed in a sling.  Still with some swelling and discomfort in her right shoulder.  Has some low back stiffness at times.  Seeing Dr Mariah Milling.  See his note for details.  Has noticed some blood pressure drops after eating.  See her attached list for details.  If she sits for 30 minutes after eating, she does better and feels better.  Planning to follow up with Dr Mariah Milling later this month.  Planning to start Midodrine.    She is also followed by Dr Gwenith Spitz - for her kidney issue.  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  Is followed at [facility].  They have been drawing her labs.  Cr usually around 1.5.  Bowels relatively stable.  Fiber helps to keep them regulated.  No chest pain or tightness.  Breathing stable.  Acid reflux controlled on protonix.  She does not want to take protonix secondary to some of the side effects listed.  She is now on zantac and this is controlling her symptoms.     Past Medical History  Diagnosis Date  . Arthritis   . GERD (gastroesophageal reflux disease)   . Hypercholesterolemia   . Hypertension   . Thin basement membrane disease   . Hypothyroidism   . Syncope and collapse   . Thin basement membrane disease   . Heart murmur     Outpatient Encounter Prescriptions as of 04/21/2013   Medication Sig Dispense Refill  . acyclovir (ZOVIRAX) 200 MG capsule Take 200 mg by mouth daily.      Marland Kitchen aspirin 81 MG tablet Take 81 mg by mouth daily.      . Calcium Carb-Cholecalciferol (CALCIUM + D3 PO) Take 600 mg by mouth 2 (two) times daily.      Marland Kitchen gabapentin (NEURONTIN) 300 MG capsule Take 300 mg by mouth at bedtime.      Marland Kitchen levothyroxine (SYNTHROID, LEVOTHROID) 50 MCG tablet Take 50 mcg by mouth daily.      Marland Kitchen lisinopril (PRINIVIL,ZESTRIL) 20 MG tablet Take 20 mg by mouth daily.      . Multiple Vitamin (MULTIVITAMIN) tablet Take 1 tablet by mouth daily.      . ranitidine (ZANTAC) 150 MG capsule Take 150 mg by mouth daily.       No facility-administered encounter medications on file as of 04/21/2013.    Review of Systems Patient denies any headache.  Minimal light headedness at times.  Had the syncopal episodes as outlined.   No significant sinus or allergy issues.   No chest pain, tightness or palpitations.  No increased shortness of breath, cough or congestion.  No nausea or vomiting.  No abdominal pain or cramping.  No bowel change, such as diarrhea, constipation, BRBPR or  melana.  No urine change  No longer taking lasix. Kidney function improved with hydration.  Still with shoulder pain and swelling as outlined.  Planning to follow up with Dr Hyacinth Meeker.        Objective:   Physical Exam  Filed Vitals:   04/21/13 1129  BP: 110/80  Pulse: 77  Temp: 98.7 F (37.1 C)   Blood pressure recheck:  55/73  72 year old female in no acute distress.   HEENT:  Nares- clear.  Oropharynx - without lesions. NECK:  Supple.  Nontender.  No audible bruit.  HEART:  Appears to be regular. LUNGS:  No crackles or wheezing audible.  Respirations even and unlabored.  RADIAL PULSE:  Equal bilaterally.  ABDOMEN:  Soft, nontender.  Bowel sounds present and normal.  No audible abdominal bruit. EXTREMITIES:  No increased edema present.  DP pulses palpable and equal bilaterally.      MSK:  Minimal soft  tissue swelling right shoulder.  Still with increased pain with full extension of her right arm.      Assessment & Plan:  SYNCOPE.  Episodes as outlined.  Seeing Dr Mariah Milling.  See his note for details.  She has been monitoring her pressures and has noticed a significant drop after eating.  See her attached records for details.  Planning to f/u with Dr Mariah Milling at the end of this month.  Per pt, planning to start Midodrine. Trying to stay hydrated.   SHOULDER PAIN.  Persistent pain and swelling s/p injury.  Planning to f/u with Dr Hyacinth Meeker.     VISUAL DISTURBANCE. See last note for details.  No reoccurrence reported.  We discussed further w/up and evaluation.  Declined head scan, neurology referral.  Wanted to hold on any further w/up.  Follow.  Continue daily aspirin.    HYPOMAG.  Last magnesium check - 1.8.    LEUKOCYTOSIS.  No evidence of infection found while in the hospital.  May have been related to an acute stress response and dehydration.  Will recheck today to confirm normal.      HEALTH MAINTENANCE.  Physical last visit.  She declined pelvic exam.  States had mammogram.   Obtain results.  Discussed colonoscopy.

## 2013-04-23 ENCOUNTER — Other Ambulatory Visit: Payer: Medicare Other

## 2013-04-24 ENCOUNTER — Encounter: Payer: Self-pay | Admitting: Internal Medicine

## 2013-04-28 ENCOUNTER — Encounter: Payer: Self-pay | Admitting: Family Medicine

## 2013-04-29 ENCOUNTER — Encounter: Payer: Self-pay | Admitting: Cardiovascular Disease

## 2013-04-29 ENCOUNTER — Ambulatory Visit (INDEPENDENT_AMBULATORY_CARE_PROVIDER_SITE_OTHER): Payer: Medicare Other | Admitting: Cardiovascular Disease

## 2013-04-29 VITALS — BP 132/81 | HR 70 | Ht 62.5 in | Wt 125.5 lb

## 2013-04-29 DIAGNOSIS — R55 Syncope and collapse: Secondary | ICD-10-CM

## 2013-04-29 DIAGNOSIS — I10 Essential (primary) hypertension: Secondary | ICD-10-CM

## 2013-04-29 NOTE — Progress Notes (Signed)
Patient ID: Kristen Bartlett, female    DOB: 06-Mar-1941, 72 y.o.   MRN: [id number]  HPI Comments: [name] is a pleasant 72 year old woman with history of basement membrane kidney disease, baseline creatinine 1.5, history of syncope in the setting of elevated creatinine, possible dehydration.  Long history of near syncope. She reports episodes  while in the shower in February 2014.  occurred after a big lunch and a hot shower. Another episode she fell in the street. Details are uncertain .She reports having less than 10 episodes, typically associated after food, often out of the house when doing errands, often while in the standing position. 2 episodes of syncope one in 02/26/2013, most recent episode may 21st 2014. For full details, see previous note.  Previous episodes occurred with creatinine 1.86 and 1.7. It was felt that dehydration was playing a role. Since her last visit, she has been monitoring her blood pressure before and after meals. There has been no significant orthostatic type changes. No further symptoms of near syncope or syncope. She took herself out of work for now and would like to go back. She states that when she is at work, she does not drink very much .  Previous admissions to the hospital did not comment on any arrhythmia  We did suggest echocardiogram and Holter monitor but she was reluctant to do this given a co-pay  she would have to pay 20%   on her last visit we did discuss options if her blood pressure was very low such as midodrine. Based on her measurements, blood pressure has not been low and typically systolic pressure runs 130, often 140   Reports having carotid ultrasound at [facility]. She was told that it was normal   Outpatient Encounter Prescriptions as of 04/29/2013  Medication Sig Dispense Refill  . acyclovir (ZOVIRAX) 200 MG capsule Take 200 mg by mouth daily.      Marland Kitchen aspirin 81 MG tablet Take 81 mg by mouth daily.      . Calcium  Carb-Cholecalciferol (CALCIUM + D3 PO) Take 600 mg by mouth 2 (two) times daily.      Marland Kitchen levothyroxine (SYNTHROID, LEVOTHROID) 50 MCG tablet Take 50 mcg by mouth daily.      Marland Kitchen lisinopril (PRINIVIL,ZESTRIL) 20 MG tablet Take 20 mg by mouth daily.      . Multiple Vitamin (MULTIVITAMIN) tablet Take 1 tablet by mouth daily.      Marland Kitchen omega-3 fish oil (MAXEPA) 1000 MG CAPS capsule Take 1 capsule by mouth 3 (three) times daily.      . ranitidine (ZANTAC) 150 MG capsule Take 150 mg by mouth daily.      . [DISCONTINUED] gabapentin (NEURONTIN) 300 MG capsule Take 300 mg by mouth as needed.        No facility-administered encounter medications on file as of 04/29/2013.     Review of Systems  Constitutional: Negative.        Episodes of malaise followed by syncope  HENT: Negative.   Eyes: Negative.   Respiratory: Negative.   Cardiovascular: Negative.   Gastrointestinal: Negative.   Musculoskeletal: Negative.   Skin: Negative.   Psychiatric/Behavioral: Negative.   All other systems reviewed and are negative.    BP 132/81  Pulse 70  Ht 5' 2.5" (1.588 m)  Wt 125 lb 8 oz (56.926 kg)  BMI 22.57 kg/m2  LMP 01/18/1984   Physical Exam  Nursing note and vitals reviewed. Constitutional: She is oriented to person, place, and  time. She appears well-developed and well-nourished.  HENT:  Head: Normocephalic.  Nose: Nose normal.  Mouth/Throat: Oropharynx is clear and moist.  Eyes: Conjunctivae are normal. Pupils are equal, round, and reactive to light.  Neck: Normal range of motion. Neck supple. No JVD present.  Cardiovascular: Normal rate, regular rhythm, S1 normal, S2 normal, normal heart sounds and intact distal pulses.  Exam reveals no gallop and no friction rub.   No murmur heard. Pulmonary/Chest: Effort normal and breath sounds normal. No respiratory distress. She has no wheezes. She has no rales. She exhibits no tenderness.  Abdominal: Soft. Bowel sounds are normal. She exhibits no distension.  There is no tenderness.  Musculoskeletal: Normal range of motion. She exhibits no edema and no tenderness.  Lymphadenopathy:    She has no cervical adenopathy.  Neurological: She is alert and oriented to person, place, and time. Coordination normal.  Skin: Skin is warm and dry. No rash noted. No erythema.  Psychiatric: She has a normal mood and affect. Her behavior is normal. Judgment and thought content normal.    Assessment and Plan

## 2013-04-29 NOTE — Patient Instructions (Signed)
You are doing well. No medication changes were made.  Please keep your fluid level up daily, ice tea Continue to monitor your blood pressure  Please call us if you have new issues that need to be addressed before your next appt.

## 2013-04-29 NOTE — Assessment & Plan Note (Signed)
Blood pressure stable on lisinopril. We'll not change the dose for now as numbers are quite good. No orthostatic type changes seen on her blood pressure numbers in the office today. Systolics in the 130-140 range with  laying sitting and standing

## 2013-04-29 NOTE — Assessment & Plan Note (Signed)
No recent episodes of syncope or near syncope. She has not been at work. She has been hydrating well. I suspect her near syncope/syncope could be from vasovagal episodes in the setting of dehydration. We have encouraged her to maintain a steady fluid hydration, even if she goes back to work. I suggested she closely monitor her blood pressure wall back at work, sitting as well as standing, before eating and after eating. A large meal followed by shopping, when she has not been drinking may not be a good option.   We will hold off on Holter and echocardiogram at this time as she is asymptomatic. This could be done if she has any further episodes.

## 2013-05-15 ENCOUNTER — Encounter: Payer: Self-pay | Admitting: Internal Medicine

## 2013-05-15 ENCOUNTER — Encounter: Payer: Self-pay | Admitting: *Deleted

## 2013-05-21 ENCOUNTER — Ambulatory Visit: Payer: Medicare Other | Admitting: Internal Medicine

## 2013-05-22 ENCOUNTER — Encounter: Payer: Self-pay | Admitting: Internal Medicine

## 2013-06-03 ENCOUNTER — Other Ambulatory Visit: Payer: Self-pay | Admitting: *Deleted

## 2013-06-03 ENCOUNTER — Encounter: Payer: Self-pay | Admitting: Internal Medicine

## 2013-06-03 MED ORDER — ACYCLOVIR 200 MG PO CAPS: 200.0000 mg | ORAL_CAPSULE | Freq: Every day | ORAL | Status: DC

## 2013-06-11 ENCOUNTER — Other Ambulatory Visit: Payer: Self-pay

## 2013-07-23 ENCOUNTER — Encounter: Payer: Self-pay | Admitting: Internal Medicine

## 2013-07-23 ENCOUNTER — Ambulatory Visit (INDEPENDENT_AMBULATORY_CARE_PROVIDER_SITE_OTHER): Payer: Medicare Other | Admitting: Internal Medicine

## 2013-07-23 VITALS — BP 122/80 | HR 67 | Temp 98.6°F | Ht 62.5 in | Wt 126.5 lb

## 2013-07-23 DIAGNOSIS — N059 Unspecified nephritic syndrome with unspecified morphologic changes: Secondary | ICD-10-CM

## 2013-07-23 DIAGNOSIS — N029 Recurrent and persistent hematuria with unspecified morphologic changes: Secondary | ICD-10-CM

## 2013-07-23 DIAGNOSIS — R55 Syncope and collapse: Secondary | ICD-10-CM

## 2013-07-23 DIAGNOSIS — Z23 Encounter for immunization: Secondary | ICD-10-CM

## 2013-07-23 DIAGNOSIS — R252 Cramp and spasm: Secondary | ICD-10-CM

## 2013-07-23 DIAGNOSIS — E039 Hypothyroidism, unspecified: Secondary | ICD-10-CM

## 2013-07-23 DIAGNOSIS — E7800 Pure hypercholesterolemia, unspecified: Secondary | ICD-10-CM

## 2013-07-23 DIAGNOSIS — M25539 Pain in unspecified wrist: Secondary | ICD-10-CM

## 2013-07-23 DIAGNOSIS — I10 Essential (primary) hypertension: Secondary | ICD-10-CM

## 2013-07-23 DIAGNOSIS — M25531 Pain in right wrist: Secondary | ICD-10-CM

## 2013-07-23 DIAGNOSIS — K219 Gastro-esophageal reflux disease without esophagitis: Secondary | ICD-10-CM

## 2013-07-23 MED ORDER — LISINOPRIL 20 MG PO TABS: 20.0000 mg | ORAL_TABLET | Freq: Every day | ORAL | Status: DC

## 2013-07-23 MED ORDER — LEVOTHYROXINE SODIUM 50 MCG PO TABS: 50.0000 ug | ORAL_TABLET | Freq: Every day | ORAL | Status: DC

## 2013-07-23 MED ORDER — ACYCLOVIR 200 MG PO CAPS: 200.0000 mg | ORAL_CAPSULE | Freq: Every day | ORAL | Status: DC

## 2013-07-26 ENCOUNTER — Encounter: Payer: Self-pay | Admitting: Internal Medicine

## 2013-07-26 DIAGNOSIS — G5600 Carpal tunnel syndrome, unspecified upper limb: Secondary | ICD-10-CM | POA: Insufficient documentation

## 2013-07-26 NOTE — Assessment & Plan Note (Signed)
Seeing Dr Hyacinth Meeker.  Follow.

## 2013-07-26 NOTE — Assessment & Plan Note (Signed)
On lisinopril.  Blood pressure doing well now.  Remain off lasix.  Follow.  Check metabolic panel.

## 2013-07-26 NOTE — Assessment & Plan Note (Signed)
Followed by nephrology.  Labs through their office.  Cr  Usually -1.4 - 1.5.  Follow.   Remain off lasix.

## 2013-07-26 NOTE — Assessment & Plan Note (Signed)
Previously had intolerance to statin medication.  Low cholesterol diet.  Needs a lipid panel.  Labs through nephrology.   

## 2013-07-26 NOTE — Progress Notes (Signed)
Subjective:    Patient ID: Kristen Bartlett, female    DOB: [birth year], 72 y.o.   MRN: [id number]  HPI 72 year old female with past history of hypertension, hypercholesterolemia and thin basement membrane disease.  She comes in today to follow up on these issues as well as for a complete physical exam.   She was admitted on 02/26/13 for what appeared to be a syncopal or near syncopal episode that occurred while she was at [facility].  See the admission H&P for details.  She informed me that approximately one to two weeks before this episode, she was having trouble with her hands and feet swelling.  She started taking lasix everyday.  She was taken to the ER and blood pressure was low and Cr and wbc count was elevated. Her temperature was low.  She was admitted and hydrated.  Was treated with abx, however no source of infection was found.  Prior to discharge, her kidney function and white count were improving.  Since her discharge, she has not had any further episodes like this.  She does report, that in the past, she has felt a strange feeling intermittently.  Never to this extent.  She states she can usually drink water and the feeling will improve.  We discussed this at length last visit.  Given the intermittent episodes of not feeling right, near syncope, etc - it was decided to refer her to cardiology for evaluation and to confirm no cardiac source (ie arrhythmia, etc) for the episodes.  Saw Dr Mariah Milling.  See his note for details.  Trying to stay hydrated.    She is also followed by Dr Gwenith Spitz - for her kidney issue.  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  Is followed at [facility].  They have been drawing her labs.  Cr usually around 1.5.  Some acid reflux reported last visit.  Had been on nexium previously.   Started protonix previous visit.  Doing better.  Acid not a problem unless she eats late.  Bowels relatively stable.  Fiber helps to keep her regulated.  No chest pain or tightness.   Breathing stable.  Some leg cramps.  Persistent pain in her right wrist.  Seeing ortho.     Past Medical History  Diagnosis Date  . Arthritis   . GERD (gastroesophageal reflux disease)   . Hypercholesterolemia   . Hypertension   . Thin basement membrane disease   . Hypothyroidism   . Syncope and collapse   . Thin basement membrane disease   . Heart murmur     Outpatient Encounter Prescriptions as of 07/23/2013  Medication Sig Dispense Refill  . acyclovir (ZOVIRAX) 200 MG capsule Take 1 capsule (200 mg total) by mouth daily.  90 capsule  1  . aspirin 81 MG tablet Take 81 mg by mouth daily.      . Calcium Carb-Cholecalciferol (CALCIUM + D3 PO) Take 600 mg by mouth 2 (two) times daily.      Marland Kitchen levothyroxine (SYNTHROID, LEVOTHROID) 50 MCG tablet Take 1 tablet (50 mcg total) by mouth daily.  90 tablet  3  . lisinopril (PRINIVIL,ZESTRIL) 20 MG tablet Take 1 tablet (20 mg total) by mouth daily.  90 tablet  3  . Multiple Vitamin (MULTIVITAMIN) tablet Take 1 tablet by mouth daily.      Marland Kitchen omega-3 fish oil (MAXEPA) 1000 MG CAPS capsule Take 1 capsule by mouth 3 (three) times daily.      . ranitidine (ZANTAC)  150 MG capsule Take 150 mg by mouth daily.      . [DISCONTINUED] acyclovir (ZOVIRAX) 200 MG capsule Take 1 capsule (200 mg total) by mouth daily.  90 capsule  0  . [DISCONTINUED] levothyroxine (SYNTHROID, LEVOTHROID) 50 MCG tablet Take 50 mcg by mouth daily.      . [DISCONTINUED] lisinopril (PRINIVIL,ZESTRIL) 20 MG tablet Take 20 mg by mouth daily.       No facility-administered encounter medications on file as of 07/23/2013.    Review of Systems Patient denies any headache, lightheadedness or dizziness now.  Had the syncopal or near syncopal episode as outlined.  No significant sinus or allergy issues.   No chest pain, tightness or palpitations.  No increased shortness of breath, cough or congestion.  No nausea or vomiting.  No abdominal pain or cramping.  No bowel change, such as diarrhea,  constipation, BRBPR or melana.  No urine change  No longer taking lasix.  We discussed this at length.  She had been taking it daily.  Feel that she was volume depleted.  Kidney function improved with hydration.  She did see Dr Deeann Saint.  Had NCS.  Diagnosed with CTS.  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  Helping.  Still with increased pain.  Plans to follow up with Dr Hyacinth Meeker.  Does report some increased leg cramps.      Objective:   Physical Exam  Filed Vitals:   07/23/13 1125  BP: 122/80  Pulse: 67  Temp: 98.6 F (26 C)   71 year old female in no acute distress.   HEENT:  Nares- clear.  Oropharynx - without lesions. NECK:  Supple.  Nontender.  No audible bruit.  HEART:  Appears to be regular. LUNGS:  No crackles or wheezing audible.  Respirations even and unlabored.  RADIAL PULSE:  Equal bilaterally.    ABDOMEN:  Soft, nontender.  Bowel sounds present and normal.  No audible abdominal bruit.   EXTREMITIES:  No increased edema present.  DP pulses palpable and equal bilaterally.          Assessment & Plan:  SYNCOPE/NEAR SYNCOPE.  Had the recent episode as outlined.  Blood pressure low.  Kidney function increased.  Responded to hydration.  Is off lasix now.  Feel was volume depleted.  Saw Dr Mariah Milling.  Refer to his note for details.  No recent problems.  Follow.      VISUAL DISTURBANCE.  Continue daily aspirin.    HYPOMAG.  Will recheck magnesium level with next labs  Having leg cramps.  Stay hydrated.   HEALTH MAINTENANCE.  Physical 03/18/13.  She declined pelvic exam.  Mammogram 01/28/13 - Birads ii.   Discussed colonoscopy.  Need to sort through above issues first.

## 2013-07-26 NOTE — Assessment & Plan Note (Signed)
Off protonix.  Pt prefers not to take.  On Zantac now.  Symptoms controlled.  Follow.   

## 2013-07-26 NOTE — Assessment & Plan Note (Signed)
On thyroid replacement.  Follow tsh.  

## 2013-07-26 NOTE — Assessment & Plan Note (Signed)
Currently stable.  Saw Dr Mariah Milling.  See his note for details.  Stay hydrated.

## 2013-07-26 NOTE — Assessment & Plan Note (Signed)
Stay hydrated.  Check electrolytes, magnesium, etc.

## 2013-08-15 ENCOUNTER — Encounter: Payer: Self-pay | Admitting: Internal Medicine

## 2013-08-15 NOTE — Telephone Encounter (Signed)
Responded to mychart message

## 2013-09-28 ENCOUNTER — Encounter: Payer: Self-pay | Admitting: Internal Medicine

## 2013-09-30 ENCOUNTER — Encounter: Payer: Self-pay | Admitting: Internal Medicine

## 2013-09-30 ENCOUNTER — Ambulatory Visit (INDEPENDENT_AMBULATORY_CARE_PROVIDER_SITE_OTHER): Payer: Medicare Other | Admitting: Internal Medicine

## 2013-09-30 VITALS — BP 120/70 | HR 60 | Temp 97.9°F | Ht 62.5 in | Wt 128.0 lb

## 2013-09-30 DIAGNOSIS — M549 Dorsalgia, unspecified: Secondary | ICD-10-CM

## 2013-09-30 DIAGNOSIS — M25559 Pain in unspecified hip: Secondary | ICD-10-CM

## 2013-09-30 DIAGNOSIS — N059 Unspecified nephritic syndrome with unspecified morphologic changes: Secondary | ICD-10-CM

## 2013-09-30 DIAGNOSIS — N029 Recurrent and persistent hematuria with unspecified morphologic changes: Secondary | ICD-10-CM

## 2013-09-30 DIAGNOSIS — I10 Essential (primary) hypertension: Secondary | ICD-10-CM

## 2013-09-30 NOTE — Progress Notes (Signed)
Pre-visit discussion using our clinic review tool. No additional management support is needed unless otherwise documented below in the visit note.  

## 2013-09-30 NOTE — Telephone Encounter (Signed)
Requested records this morning by phone. (could not get fax to go through yesterday)

## 2013-10-01 ENCOUNTER — Ambulatory Visit (INDEPENDENT_AMBULATORY_CARE_PROVIDER_SITE_OTHER)
Admission: RE | Admit: 2013-10-01 | Discharge: 2013-10-01 | Disposition: A | Discharge status: Home / Self Care | Payer: Medicare Other | Source: Ambulatory Visit | Attending: Internal Medicine | Admitting: Internal Medicine

## 2013-10-01 DIAGNOSIS — M25559 Pain in unspecified hip: Secondary | ICD-10-CM

## 2013-10-01 DIAGNOSIS — M549 Dorsalgia, unspecified: Secondary | ICD-10-CM

## 2013-10-03 ENCOUNTER — Other Ambulatory Visit: Payer: Self-pay | Admitting: Internal Medicine

## 2013-10-03 ENCOUNTER — Encounter: Payer: Self-pay | Admitting: Internal Medicine

## 2013-10-03 DIAGNOSIS — M25559 Pain in unspecified hip: Secondary | ICD-10-CM

## 2013-10-03 DIAGNOSIS — M549 Dorsalgia, unspecified: Secondary | ICD-10-CM

## 2013-10-03 NOTE — Progress Notes (Signed)
Order placed for bone scan.  

## 2013-10-05 ENCOUNTER — Encounter: Payer: Self-pay | Admitting: Internal Medicine

## 2013-10-05 DIAGNOSIS — M25559 Pain in unspecified hip: Secondary | ICD-10-CM | POA: Insufficient documentation

## 2013-10-05 NOTE — Assessment & Plan Note (Signed)
On lisinopril.  Blood pressure doing well now.  Remain off lasix.  Follow.  Follow metabolic panel.    

## 2013-10-05 NOTE — Progress Notes (Signed)
Subjective:    Patient ID: Kristen Bartlett, female    DOB: [birth year], 72 y.o.   MRN: [id number]  HPI 72 year old female with past history of hypertension, hypercholesterolemia and thin basement membrane disease.  She comes in today as a work in with concerns regarding left hip pain initially.  The pain started three weeks ago.  No known injury or trauma.  Over the next few days, the pain progressed to the right hip.  One week later, persistent hip pain an pain progressed to her lower buttock (bilaterally).  Taking tylenol.  Helps.  Increased pain with bowel movements.  Increased pain with certain movements.     She is also followed by Dr Gwenith Spitz - for her kidney issue.  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  Is followed at [facility].  They have been drawing her labs.  Cr usually around 1.5.  Bowels relatively stable.  Fiber helps to keep her regulated.  No chest pain or tightness.  Breathing stable.     Past Medical History  Diagnosis Date  . Arthritis   . GERD (gastroesophageal reflux disease)   . Hypercholesterolemia   . Hypertension   . Thin basement membrane disease   . Hypothyroidism   . Syncope and collapse   . Thin basement membrane disease   . Heart murmur     Outpatient Encounter Prescriptions as of 09/30/2013  Medication Sig  . acyclovir (ZOVIRAX) 200 MG capsule Take 1 capsule (200 mg total) by mouth daily.  Marland Kitchen aspirin 81 MG tablet Take 81 mg by mouth daily.  . Calcium Carb-Cholecalciferol (CALCIUM + D3 PO) Take 600 mg by mouth 2 (two) times daily.  Marland Kitchen levothyroxine (SYNTHROID, LEVOTHROID) 50 MCG tablet Take 1 tablet (50 mcg total) by mouth daily.  Marland Kitchen lisinopril (PRINIVIL,ZESTRIL) 20 MG tablet Take 1 tablet (20 mg total) by mouth daily.  . Multiple Vitamin (MULTIVITAMIN) tablet Take 1 tablet by mouth daily.  Marland Kitchen omega-3 fish oil (MAXEPA) 1000 MG CAPS capsule Take 1 capsule by mouth 2 (two) times daily.   . ranitidine (ZANTAC) 150 MG capsule Take 150 mg by mouth  daily.    Review of Systems Patient denies any headache, lightheadedness or dizziness now.   No significant sinus or allergy issues.   No chest pain, tightness or palpitations.  No increased shortness of breath, cough or congestion.  No nausea or vomiting.  No abdominal pain or cramping.  No bowel change, such as diarrhea, constipation, BRBPR or melana.  No urine change  She did see Dr Deeann Saint.  Had NCS.  Diagnosed with CTS.  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  Helping.  Now with increased hip, back and lower buttock pain.  Started left hip and progressed.       Objective:   Physical Exam  Filed Vitals:   09/30/13 1512  BP: 120/70  Pulse: 60  Temp: 97.9 F (90.65 C)   72 year old female in no acute distress.    HEART:  Appears to be regular. LUNGS:  No crackles or wheezing audible.  Respirations even and unlabored.  RADIAL PULSE:  Equal bilaterally.    ABDOMEN:  Soft, nontender.  Bowel sounds present and normal.  No audible abdominal bruit.   EXTREMITIES:  No increased edema present.  DP pulses palpable and equal bilaterally.   MSK:  No pain straight leg raise.  Increased pain with going from sitting to standing.  Increased pain with certain movements - ambulating.   No  muscle weakness appreciated.        Assessment & Plan:  HEALTH MAINTENANCE.  Physical 03/18/13.  She declined pelvic exam.  Mammogram 01/28/13 - Birads ii.   Discussed colonoscopy.  Need to sort through above issues first.    I spent 25 minutes with the patient and more than 50% if the time was spent in consultation regarding the above.

## 2013-10-05 NOTE — Assessment & Plan Note (Signed)
Started with acute left hip pain.  Progressed to the right hip.  Now with pain also involving her buttocks (bilaterally).  Increased pain.  Tylenol helps.  Avoid antiinflammatories.  Concern over possible sacral stress fracture, degenerative spondylolisthesis, or less likely PMR.  Having no shoulder pain.  Will check ESR and CK.  Check L-S spine xray and bilateral hip xray.  If unrevealing, then obtain bone scan.  Follow closely.

## 2013-10-05 NOTE — Assessment & Plan Note (Signed)
Followed by nephrology.  Labs through their office.  Cr  Usually -1.4 - 1.5.  Follow.   Remain off lasix.  Avoid antiinflammatories.

## 2013-10-06 NOTE — Telephone Encounter (Signed)
Order placed for ortho referral.   

## 2013-10-07 ENCOUNTER — Encounter: Payer: Self-pay | Admitting: Internal Medicine

## 2013-10-08 ENCOUNTER — Ambulatory Visit: Payer: Self-pay | Admitting: Internal Medicine

## 2013-10-12 ENCOUNTER — Encounter: Payer: Self-pay | Admitting: Internal Medicine

## 2013-10-13 ENCOUNTER — Telehealth: Payer: Self-pay | Admitting: Internal Medicine

## 2013-10-13 NOTE — Telephone Encounter (Signed)
CD made and pt was called to pick up.

## 2013-10-13 NOTE — Telephone Encounter (Signed)
Pt was sent here for x-rays by Dr. Lorin Picket.  She needs to go to an ortho and needs to pick up her x-rays asap.  Please call pt.

## 2013-10-23 ENCOUNTER — Encounter: Payer: Self-pay | Admitting: Internal Medicine

## 2013-11-10 ENCOUNTER — Encounter: Payer: Self-pay | Admitting: Adult Health

## 2013-11-10 ENCOUNTER — Ambulatory Visit (INDEPENDENT_AMBULATORY_CARE_PROVIDER_SITE_OTHER): Payer: Medicare HMO | Admitting: Adult Health

## 2013-11-10 VITALS — BP 140/64 | HR 76 | Temp 97.6°F | Resp 12 | Wt 130.0 lb

## 2013-11-10 DIAGNOSIS — R300 Dysuria: Secondary | ICD-10-CM

## 2013-11-10 LAB — POCT URINALYSIS DIPSTICK
BILIRUBIN UA: NEGATIVE
Glucose, UA: NEGATIVE
Ketones, UA: NEGATIVE
NITRITE UA: NEGATIVE
PH UA: 6
Protein, UA: NEGATIVE
Spec Grav, UA: 1.01
Urobilinogen, UA: 0.2

## 2013-11-10 MED ORDER — CIPROFLOXACIN HCL 500 MG PO TABS: 500.0000 mg | ORAL_TABLET | Freq: Two times a day (BID) | ORAL | Status: DC

## 2013-11-10 NOTE — Assessment & Plan Note (Signed)
UA shows possible urinary tract infection. Send urine for culture. Creatinine clearance from most recent labs is 33.78 and antibiotic dosed accordingly as follows: - start Cipro 500 mg every 12 hours x7 days. Once results of culture obtained will adjust medication if necessary. RTC if symptoms worsen or if no improvement

## 2013-11-10 NOTE — Progress Notes (Signed)
   Subjective:    Patient ID: Kristen Bartlett, female    DOB: 1941-09-26, 73 y.o.   MRN: [id number]  HPI  Pt is a 73 y/o female who presents with symptoms of dysuria, urgency and frequency. Symptoms began over the weekend. She feels symptoms are worsening. Also reports episodes of incontinence.  Current Outpatient Prescriptions on File Prior to Visit  Medication Sig Dispense Refill  . acyclovir (ZOVIRAX) 200 MG capsule Take 1 capsule (200 mg total) by mouth daily.  90 capsule  1  . aspirin 81 MG tablet Take 81 mg by mouth daily.      . Calcium Carb-Cholecalciferol (CALCIUM + D3 PO) Take 600 mg by mouth 2 (two) times daily.      Marland Kitchen. levothyroxine (SYNTHROID, LEVOTHROID) 50 MCG tablet Take 1 tablet (50 mcg total) by mouth daily.  90 tablet  3  . lisinopril (PRINIVIL,ZESTRIL) 20 MG tablet Take 1 tablet (20 mg total) by mouth daily.  90 tablet  3  . Multiple Vitamin (MULTIVITAMIN) tablet Take 1 tablet by mouth daily.      Marland Kitchen. omega-3 fish oil (MAXEPA) 1000 MG CAPS capsule Take 1 capsule by mouth 2 (two) times daily.       . ranitidine (ZANTAC) 150 MG capsule Take 150 mg by mouth daily.       No current facility-administered medications on file prior to visit.      Review of Systems  Constitutional: Negative for fever and chills.  Genitourinary: Positive for dysuria, urgency and frequency. Negative for hematuria and flank pain.       Objective:   Physical Exam  Constitutional: She is oriented to person, place, and time. She appears well-developed and well-nourished. No distress.  Cardiovascular: Normal rate and regular rhythm.   Pulmonary/Chest: Effort normal. No respiratory distress.  Genitourinary:  No suprapubic discomfort  Neurological: She is alert and oriented to person, place, and time.  Skin: Skin is warm and dry.  Psychiatric: She has a normal mood and affect. Her behavior is normal. Judgment and thought content normal.    BP 140/64  Pulse 76  Temp(Src) 97.6 F (36.4 C)  (Oral)  Resp 12  Wt 130 lb (58.968 kg)  SpO2 97%  LMP 01/18/1984       Assessment & Plan:

## 2013-11-10 NOTE — Patient Instructions (Signed)
  Start Cipro 500 mg twice a day for 7 days.  Once we receive the results of the culture we will notify you if any changes need to be made.  Drink fluids to try to keep your urine straw colored.

## 2013-11-10 NOTE — Progress Notes (Signed)
Pre visit review using our clinic review tool, if applicable. No additional management support is needed unless otherwise documented below in the visit note. 

## 2013-11-12 ENCOUNTER — Encounter: Payer: Self-pay | Admitting: Adult Health

## 2013-11-12 LAB — URINE CULTURE: Colony Count: >=100000

## 2013-11-27 ENCOUNTER — Ambulatory Visit (INDEPENDENT_AMBULATORY_CARE_PROVIDER_SITE_OTHER): Payer: Medicare HMO | Admitting: Internal Medicine

## 2013-11-27 ENCOUNTER — Encounter: Payer: Self-pay | Admitting: Internal Medicine

## 2013-11-27 VITALS — BP 130/70 | HR 75 | Temp 98.7°F | Ht 62.5 in | Wt 130.8 lb

## 2013-11-27 DIAGNOSIS — N059 Unspecified nephritic syndrome with unspecified morphologic changes: Secondary | ICD-10-CM

## 2013-11-27 DIAGNOSIS — M25559 Pain in unspecified hip: Secondary | ICD-10-CM

## 2013-11-27 DIAGNOSIS — K219 Gastro-esophageal reflux disease without esophagitis: Secondary | ICD-10-CM

## 2013-11-27 DIAGNOSIS — E7800 Pure hypercholesterolemia, unspecified: Secondary | ICD-10-CM

## 2013-11-27 DIAGNOSIS — N029 Recurrent and persistent hematuria with unspecified morphologic changes: Secondary | ICD-10-CM

## 2013-11-27 DIAGNOSIS — I10 Essential (primary) hypertension: Secondary | ICD-10-CM

## 2013-11-27 DIAGNOSIS — E039 Hypothyroidism, unspecified: Secondary | ICD-10-CM

## 2013-11-27 DIAGNOSIS — R55 Syncope and collapse: Secondary | ICD-10-CM

## 2013-11-27 DIAGNOSIS — G5600 Carpal tunnel syndrome, unspecified upper limb: Secondary | ICD-10-CM

## 2013-11-27 NOTE — Progress Notes (Signed)
Pre-visit discussion using our clinic review tool. No additional management support is needed unless otherwise documented below in the visit note.  

## 2013-11-30 ENCOUNTER — Encounter: Payer: Self-pay | Admitting: Internal Medicine

## 2013-11-30 NOTE — Assessment & Plan Note (Signed)
Currently stable.  Saw Dr Gollan.  See his note for details.  Stay hydrated.  No further reoccurrence.    

## 2013-11-30 NOTE — Assessment & Plan Note (Signed)
Followed by nephrology.  Labs through their office.  Cr  Usually -1.4 - 1.5.  Follow.   Remain off lasix.  Avoid antiinflammatories and other nephrotoxic drugs.    

## 2013-11-30 NOTE — Assessment & Plan Note (Signed)
On lisinopril.  Blood pressure doing well now.  Remain off lasix.  Follow.  Follow metabolic panel.

## 2013-11-30 NOTE — Progress Notes (Signed)
Subjective:    Patient ID: Kristen Bartlett, female    DOB: [birth year], 73 y.o.   MRN: [id number]  HPI 73 year old female with past history of hypertension, hypercholesterolemia and thin basement membrane disease.  She comes in today for a scheduled follow up.  Her hip/back better.  Still bothers her at times, but better.  She is planning to have carpal tunnel surgery 12/15/13.  Going for pre op 2/2/1-.  She is having increased pain in her hand and arm.  Affecting her sleep.  No chest pain or tightness.  No sob.  No nausea or vomiting.  Has had no more syncope or near syncopal episodes.  Taking gabapentin and tylenol.   Recently treated for uti.  Took cipro.  Symptoms resolved.  Has noticed some change in her bowel habits.  Stool hard.  Previously regular.  No drinking and staying hydrated like she should.    She is also followed by Dr Gwenith SpitzEmily Chang - for her kidney issue.  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  [facility]  They have been drawing her labs.  Cr usually around 1.5.  Bowels relatively stable.  Fiber helps to keep her regulated.      Past Medical History  Diagnosis Date  . Arthritis   . GERD (gastroesophageal reflux disease)   . Hypercholesterolemia   . Hypertension   . Thin basement membrane disease   . Hypothyroidism   . Syncope and collapse   . Thin basement membrane disease   . Heart murmur     Outpatient Encounter Prescriptions as of 11/27/2013  Medication Sig  . acyclovir (ZOVIRAX) 200 MG capsule Take 1 capsule (200 mg total) by mouth daily.  Marland Kitchen. aspirin 81 MG tablet Take 81 mg by mouth daily.  . Calcium Carb-Cholecalciferol (CALCIUM + D3 PO) Take 600 mg by mouth 2 (two) times daily.  . furosemide (LASIX) 20 MG tablet Take 20 mg by mouth daily as needed.  . gabapentin (NEURONTIN) 300 MG capsule Take 300 mg by mouth 2 (two) times daily.  Marland Kitchen. levothyroxine (SYNTHROID, LEVOTHROID) 50 MCG tablet Take 1 tablet (50 mcg total) by mouth daily.  Marland Kitchen. lisinopril  (PRINIVIL,ZESTRIL) 20 MG tablet Take 1 tablet (20 mg total) by mouth daily.  . Multiple Vitamin (MULTIVITAMIN) tablet Take 1 tablet by mouth daily.  Marland Kitchen. omega-3 fish oil (MAXEPA) 1000 MG CAPS capsule Take 1 capsule by mouth 2 (two) times daily.   . ranitidine (ZANTAC) 150 MG capsule Take 150 mg by mouth daily.  . [DISCONTINUED] ciprofloxacin (CIPRO) 500 MG tablet Take 1 tablet (500 mg total) by mouth 2 (two) times daily.    Review of Systems Patient denies any headache, lightheadedness or dizziness now.   No significant sinus or allergy issues.   No chest pain, tightness or palpitations.  No increased shortness of breath, cough or congestion.  No nausea or vomiting.  No abdominal pain or cramping.  No bowel change, such as diarrhea, BRBPR or melana.  Stool hard.  Still having bowel movements regularly.  No urine change  She did see Dr Deeann SaintHoward Miller.  Had NCS.  Diagnosed with CTS.  Planning for surgery 12/15/13.  Taking gabapentin.        Objective:   Physical Exam  Filed Vitals:   11/27/13 1117  BP: 130/70  Pulse: 75  Temp: 98.7 F (4437.461 C)   73 year old female in no acute distress.  HEENT:  Nares clear.  Oropharynx without  lesions.   NECK:  Supple, non tender. No audible bruit.     HEART:  Appears to be regular. LUNGS:  No crackles or wheezing audible.  Respirations even and unlabored.  RADIAL PULSE:  Equal bilaterally.    ABDOMEN:  Soft, nontender.  Bowel sounds present and normal.  No audible abdominal bruit.   EXTREMITIES:  No increased edema present.  DP pulses palpable and equal bilaterally.        Assessment & Plan:  HEALTH MAINTENANCE.  Physical 03/18/13.  She declined pelvic exam.  Mammogram 01/28/13 - Birads ii.   Discussed colonoscopy.  Need to sort through above issues first.    I spent 25 minutes with the patient and more than 50% if the time was spent in consultation regarding the above.

## 2013-11-30 NOTE — Assessment & Plan Note (Signed)
Better.  Taking tylenol.  Seeing Dr Miller.    

## 2013-11-30 NOTE — Assessment & Plan Note (Signed)
On thyroid replacement.  Follow tsh.  

## 2013-11-30 NOTE — Assessment & Plan Note (Signed)
Seeing Dr Hyacinth MeekerMiller.  Planning for surgery 12/15/13.  Pre op 12/08/13.

## 2013-11-30 NOTE — Assessment & Plan Note (Signed)
Off protonix.  Pt prefers not to take.  On Zantac now.  Symptoms controlled.  Follow.   

## 2013-11-30 NOTE — Assessment & Plan Note (Signed)
Previously had intolerance to statin medication.  Low cholesterol diet.  Needs a lipid panel.  Labs through nephrology.

## 2013-12-08 ENCOUNTER — Ambulatory Visit: Payer: Self-pay | Admitting: Specialist

## 2013-12-15 ENCOUNTER — Ambulatory Visit: Payer: Self-pay | Admitting: Specialist

## 2013-12-18 ENCOUNTER — Encounter: Payer: Self-pay | Admitting: Internal Medicine

## 2014-02-02 ENCOUNTER — Telehealth: Payer: Self-pay | Admitting: Internal Medicine

## 2014-02-02 ENCOUNTER — Other Ambulatory Visit: Payer: Self-pay | Admitting: *Deleted

## 2014-02-02 MED ORDER — LISINOPRIL 20 MG PO TABS: 20.0000 mg | ORAL_TABLET | Freq: Every day | ORAL | Status: DC

## 2014-02-02 MED ORDER — LEVOTHYROXINE SODIUM 50 MCG PO TABS: 50.0000 ug | ORAL_TABLET | Freq: Every day | ORAL | Status: DC

## 2014-02-02 MED ORDER — ACYCLOVIR 200 MG PO CAPS: 200.0000 mg | ORAL_CAPSULE | Freq: Every day | ORAL | Status: DC

## 2014-02-02 NOTE — Telephone Encounter (Signed)
Pt wanted to let you know that [facility] would be contacting you with refill request/ She also wanted to let you know as of Nov 06 2013 her rx has changed to [facility] mail order

## 2014-02-02 NOTE — Telephone Encounter (Signed)
Pharmacy updated.

## 2014-02-04 ENCOUNTER — Telehealth: Payer: Self-pay | Admitting: Internal Medicine

## 2014-02-04 NOTE — Telephone Encounter (Signed)
Received referral approval from [employer] for Dr. Gwenith SpitzEmily Bartlett. Approved 4.1.2015-6.30.2015 For 4 visits  Diagnoses 585.3  Diagnoses 250.5  Unlisted E& M services

## 2014-02-04 NOTE — Telephone Encounter (Signed)
F[facility] for [facility] to [facility]  Dr. Gwenith SpitzEmily Chang.

## 2014-02-05 ENCOUNTER — Encounter: Payer: Self-pay | Admitting: Internal Medicine

## 2014-02-19 NOTE — Telephone Encounter (Signed)
Unread mychart message mailed to patient 

## 2014-03-24 ENCOUNTER — Ambulatory Visit (INDEPENDENT_AMBULATORY_CARE_PROVIDER_SITE_OTHER): Payer: Commercial Managed Care - HMO | Admitting: Internal Medicine

## 2014-03-24 ENCOUNTER — Encounter: Payer: Self-pay | Admitting: Internal Medicine

## 2014-03-24 VITALS — BP 110/60 | HR 85 | Temp 98.7°F | Ht 62.5 in | Wt 130.8 lb

## 2014-03-24 DIAGNOSIS — E039 Hypothyroidism, unspecified: Secondary | ICD-10-CM

## 2014-03-24 DIAGNOSIS — R55 Syncope and collapse: Secondary | ICD-10-CM

## 2014-03-24 DIAGNOSIS — N059 Unspecified nephritic syndrome with unspecified morphologic changes: Secondary | ICD-10-CM

## 2014-03-24 DIAGNOSIS — K219 Gastro-esophageal reflux disease without esophagitis: Secondary | ICD-10-CM

## 2014-03-24 DIAGNOSIS — N029 Recurrent and persistent hematuria with unspecified morphologic changes: Secondary | ICD-10-CM

## 2014-03-24 DIAGNOSIS — E7800 Pure hypercholesterolemia, unspecified: Secondary | ICD-10-CM

## 2014-03-24 DIAGNOSIS — G5600 Carpal tunnel syndrome, unspecified upper limb: Secondary | ICD-10-CM

## 2014-03-24 DIAGNOSIS — M25559 Pain in unspecified hip: Secondary | ICD-10-CM

## 2014-03-24 DIAGNOSIS — I10 Essential (primary) hypertension: Secondary | ICD-10-CM

## 2014-03-24 NOTE — Progress Notes (Signed)
Subjective:    Patient ID: Kristen Bartlett, female    DOB: 1941-08-01, 73 y.o.   MRN: [id number]  HPI 73 year old female with past history of hypertension, hypercholesterolemia and thin basement membrane disease.  She comes in today to follow up on these issues as well as for a complete physical exam.   Her hip/back better.  Still bothers her at times, but better.  Is s/p carpal tunnel surgery 12/15/13.  Hand pain is better.  Sleeping better.  Had physical therapy.   No chest pain or tightness.  No sob.  No nausea or vomiting.  Has had no more syncope or near syncopal episodes.  Overall she feels she is doing better.  Stays active.  Back at work.    She is also followed by Dr Gwenith SpitzEmily Chang - for her kidney issue.  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  [facility]  They have been drawing her labs.  Cr usually around 1.5.  Bowels stable.  Fiber helps to keep her regulated.      Past Medical History  Diagnosis Date  . Arthritis   . GERD (gastroesophageal reflux disease)   . Hypercholesterolemia   . Hypertension   . Thin basement membrane disease   . Hypothyroidism   . Syncope and collapse   . Thin basement membrane disease   . Heart murmur     Outpatient Encounter Prescriptions as of 03/24/2014  Medication Sig  . acyclovir (ZOVIRAX) 200 MG capsule Take 1 capsule (200 mg total) by mouth daily.  Marland Kitchen. aspirin 81 MG tablet Take 81 mg by mouth daily.  . Calcium Carb-Cholecalciferol (CALCIUM + D3 PO) Take 600 mg by mouth 2 (two) times daily.  . furosemide (LASIX) 20 MG tablet Take 20 mg by mouth daily as needed.  Marland Kitchen. levothyroxine (SYNTHROID, LEVOTHROID) 50 MCG tablet Take 1 tablet (50 mcg total) by mouth daily.  Marland Kitchen. lisinopril (PRINIVIL,ZESTRIL) 20 MG tablet Take 1 tablet (20 mg total) by mouth daily.  . Multiple Vitamin (MULTIVITAMIN) tablet Take 1 tablet by mouth daily.  Marland Kitchen. omega-3 fish oil (MAXEPA) 1000 MG CAPS capsule Take 1 capsule by mouth 2 (two) times daily.   .  ranitidine (ZANTAC) 150 MG capsule Take 150 mg by mouth daily.  Marland Kitchen. gabapentin (NEURONTIN) 300 MG capsule Take 300 mg by mouth 2 (two) times daily.    Review of Systems Patient denies any headache, lightheadedness or dizziness.  No significant sinus or allergy issues.   No chest pain, tightness or palpitations.  No increased shortness of breath, cough or congestion.  No nausea or vomiting.  No abdominal pain or cramping.  No bowel change, such as diarrhea, BRBPR or melana.   No urine change  Seeing Dr Deeann SaintHoward Miller.  S/p carpal tunnel surgery.  Pain is better.  Still with some pain, but overall improved.   She had questions about Prevnar and Zostavax.  Will let me know if agreeable to get these injections.       Objective:   Physical Exam  Filed Vitals:   03/24/14 1329  BP: 110/60  Pulse: 85  Temp: 98.7 F (37.1 C)   Blood pressure recheck:  33122/7160  73 year old female in no acute distress.   HEENT:  Nares- clear.  Oropharynx - without lesions. NECK:  Supple.  Nontender.  No audible bruit.  HEART:  Appears to be regular. LUNGS:  No crackles or wheezing audible.  Respirations even and unlabored.  RADIAL  PULSE:  Equal bilaterally.    BREASTS:  No nipple discharge or nipple retraction present.  Could not appreciate any distinct nodules or axillary adenopathy.  ABDOMEN:  Soft, nontender.  Bowel sounds present and normal.  No audible abdominal bruit.  GU:  Not performed.    EXTREMITIES:  No increased edema present.  DP pulses palpable and equal bilaterally.          Assessment & Plan:  HEALTH MAINTENANCE.  Physical today.  Mammogram 01/28/13 - Birads II.   She declines further mammograms.  Discussed colonoscopy.  Needs.     I spent 25 minutes with the patient and more than 50% if the time was spent in consultation regarding the above.

## 2014-03-24 NOTE — Progress Notes (Signed)
Pre visit review using our clinic review tool, if applicable. No additional management support is needed unless otherwise documented below in the visit note. 

## 2014-03-30 ENCOUNTER — Encounter: Payer: Self-pay | Admitting: Internal Medicine

## 2014-03-30 NOTE — Assessment & Plan Note (Signed)
Better.  Taking tylenol.  Seeing Dr Hyacinth Meeker.

## 2014-03-30 NOTE — Assessment & Plan Note (Signed)
Followed by nephrology.  Labs through their office.  Cr  Usually -1.4 - 1.5.  Follow.   Remain off lasix.  Avoid antiinflammatories and other nephrotoxic drugs.

## 2014-03-30 NOTE — Assessment & Plan Note (Signed)
Seeing Dr Hyacinth Meeker.  S/p surgery 12/15/13.  Still with some pain, but improved.  Continue exercises.

## 2014-03-30 NOTE — Assessment & Plan Note (Signed)
On lisinopril.  Blood pressure doing well.  Remain off lasix.  Follow.  Follow metabolic panel.

## 2014-03-30 NOTE — Assessment & Plan Note (Signed)
On thyroid replacement.  Follow tsh.  

## 2014-03-30 NOTE — Assessment & Plan Note (Signed)
Off protonix.  Pt prefers not to take.  On Zantac now.  Symptoms controlled.  Follow.

## 2014-03-30 NOTE — Assessment & Plan Note (Signed)
Previously had intolerance to statin medication.  Low cholesterol diet.  Labs through nephrology.

## 2014-03-30 NOTE — Assessment & Plan Note (Signed)
Currently stable.  Saw Dr Mariah Milling.  See his note for details.  Stay hydrated.  No further reoccurrence.

## 2014-04-15 ENCOUNTER — Other Ambulatory Visit: Payer: Self-pay | Admitting: *Deleted

## 2014-04-17 ENCOUNTER — Encounter: Payer: Self-pay | Admitting: Internal Medicine

## 2014-04-26 ENCOUNTER — Other Ambulatory Visit: Payer: Self-pay | Admitting: Internal Medicine

## 2014-04-28 MED ORDER — LEVOTHYROXINE SODIUM 50 MCG PO TABS: 50.0000 ug | ORAL_TABLET | Freq: Every day | ORAL | Status: DC

## 2014-06-11 ENCOUNTER — Other Ambulatory Visit: Payer: Self-pay | Admitting: *Deleted

## 2014-06-11 MED ORDER — LEVOTHYROXINE SODIUM 50 MCG PO TABS: 50.0000 ug | ORAL_TABLET | Freq: Every day | ORAL | Status: AC

## 2014-07-13 ENCOUNTER — Encounter: Payer: Self-pay | Admitting: Internal Medicine

## 2014-07-28 ENCOUNTER — Ambulatory Visit (INDEPENDENT_AMBULATORY_CARE_PROVIDER_SITE_OTHER): Payer: Commercial Managed Care - HMO | Admitting: Internal Medicine

## 2014-07-28 ENCOUNTER — Encounter: Payer: Self-pay | Admitting: Internal Medicine

## 2014-07-28 VITALS — BP 120/60 | HR 54 | Temp 98.2°F | Ht 62.5 in | Wt 132.5 lb

## 2014-07-28 DIAGNOSIS — R55 Syncope and collapse: Secondary | ICD-10-CM

## 2014-07-28 DIAGNOSIS — N059 Unspecified nephritic syndrome with unspecified morphologic changes: Secondary | ICD-10-CM

## 2014-07-28 DIAGNOSIS — G5600 Carpal tunnel syndrome, unspecified upper limb: Secondary | ICD-10-CM

## 2014-07-28 DIAGNOSIS — G5601 Carpal tunnel syndrome, right upper limb: Secondary | ICD-10-CM

## 2014-07-28 DIAGNOSIS — E039 Hypothyroidism, unspecified: Secondary | ICD-10-CM

## 2014-07-28 DIAGNOSIS — N029 Recurrent and persistent hematuria with unspecified morphologic changes: Secondary | ICD-10-CM

## 2014-07-28 DIAGNOSIS — R05 Cough: Secondary | ICD-10-CM

## 2014-07-28 DIAGNOSIS — R059 Cough, unspecified: Secondary | ICD-10-CM

## 2014-07-28 DIAGNOSIS — I10 Essential (primary) hypertension: Secondary | ICD-10-CM

## 2014-07-28 DIAGNOSIS — K219 Gastro-esophageal reflux disease without esophagitis: Secondary | ICD-10-CM

## 2014-07-28 DIAGNOSIS — H00016 Hordeolum externum left eye, unspecified eyelid: Secondary | ICD-10-CM

## 2014-07-28 DIAGNOSIS — H00019 Hordeolum externum unspecified eye, unspecified eyelid: Secondary | ICD-10-CM

## 2014-07-28 DIAGNOSIS — E7800 Pure hypercholesterolemia, unspecified: Secondary | ICD-10-CM

## 2014-07-28 LAB — LIPID PANEL
CHOLESTEROL: 211 mg/dL — AB (ref 0–200)
HDL: 75 mg/dL — AB (ref 35–70)
LDL Cholesterol: 121 mg/dL
Triglycerides: 76 mg/dL (ref 40–160)

## 2014-07-28 LAB — BASIC METABOLIC PANEL
BUN: 40 mg/dL — AB (ref 4–21)
Creatinine: 1.6 mg/dL — AB (ref 0.5–1.1)
Glucose: 95 mg/dL
Potassium: 4.7 mmol/L (ref 3.4–5.3)
Sodium: 139 mmol/L (ref 137–147)

## 2014-07-28 LAB — TSH: TSH: 0.8 u[IU]/mL (ref 0.41–5.90)

## 2014-07-28 MED ORDER — ACYCLOVIR 200 MG PO CAPS: 200.0000 mg | ORAL_CAPSULE | Freq: Every day | ORAL | Status: AC

## 2014-07-28 NOTE — Progress Notes (Signed)
Subjective:    Patient ID: Kristen Bartlett, female    DOB: 07-26-1941, 73 y.o.   MRN: [id number]  HPI 73 year old female with past history of hypertension, hypercholesterolemia and thin basement membrane disease.  She comes in today for a scheduled follow up.  Her hip/back better.  Still bothers her at times, but better.  Is s/p carpal tunnel surgery 12/15/13.  Hand pain is better.  Sleeping better.  Had physical therapy.   No chest pain or tightness.  No sob.  No nausea or vomiting.  Has had no more syncope or near syncopal episodes.  Overall she feels she is doing better.  Stays active.  Back at work.  She reports noticing recently a persistent dry cough.  Started approximately three months ago.  Does notice some stuffiness and drainage.  No sinus pressure.  No chest congestion or chest tightness.  No sob.  She has noticed the need to clear her throat.  On zantac.  She also noticed that four days ago, her left eyelid became a little swollen.  No pain.  Is better today.  No vision change.  Overall she feels she is doing relatively well.    She is also followed by Dr Gwenith Spitz - for her kidney issue.  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  Is followed at [facility].  They have been drawing her labs.  Cr usually around 1.5.  Bowels stable.  Fiber helps to keep her regulated.      Past Medical History  Diagnosis Date  . Arthritis   . GERD (gastroesophageal reflux disease)   . Hypercholesterolemia   . Hypertension   . Thin basement membrane disease   . Hypothyroidism   . Syncope and collapse   . Thin basement membrane disease   . Heart murmur     Outpatient Encounter Prescriptions as of 07/28/2014  Medication Sig  . acyclovir (ZOVIRAX) 200 MG capsule Take 1 capsule (200 mg total) by mouth daily.  Marland Kitchen aspirin 81 MG tablet Take 81 mg by mouth daily.  . Calcium Carb-Cholecalciferol (CALCIUM + D3 PO) Take 600 mg by mouth 2 (two) times daily.  . furosemide (LASIX) 20 MG tablet Take 20 mg  by mouth daily as needed.  Marland Kitchen levothyroxine (SYNTHROID, LEVOTHROID) 50 MCG tablet Take 1 tablet (50 mcg total) by mouth daily.  Marland Kitchen lisinopril (PRINIVIL,ZESTRIL) 20 MG tablet Take 1 tablet (20 mg total) by mouth daily.  . Multiple Vitamin (MULTIVITAMIN) tablet Take 1 tablet by mouth daily.  Marland Kitchen omega-3 fish oil (MAXEPA) 1000 MG CAPS capsule Take 1 capsule by mouth 2 (two) times daily.   . ranitidine (ZANTAC) 150 MG capsule Take 150 mg by mouth daily.  . [DISCONTINUED] gabapentin (NEURONTIN) 300 MG capsule Take 300 mg by mouth 2 (two) times daily.    Review of Systems Patient denies any headache, lightheadedness or dizziness.  Some stuffiness and drainage as outlined.  No fever.  No increased sinus pressure.  No chest pain, tightness or palpitations.  No increased shortness of breath or chest congestion.  Persistent dry cough.  Clearing of her throat.  No nausea or vomiting.  No abdominal pain or cramping.  No bowel change, such as diarrhea, BRBPR or melana.   No urine change.  Was seeing Dr Deeann Saint.  S/p carpal tunnel surgery.  Pain is better.  Still with some pain, but overall improved.   Left eyelid swollen recently.  Better now.  Objective:   Physical Exam  Filed Vitals:   07/28/14 0926  BP: 120/60  Pulse: 54  Temp: 98.2 F (36.8 C)   Blood pressure recheck: 75/16  73 year old female in no acute distress.   HEENT:  Nares- clear.  Oropharynx - without lesions.  Minimal redness and minimal swelling - left eyelid.  No erythema of the eye.  No pain with eye movements.   NECK:  Supple.  Nontender.  No audible bruit.  HEART:  Appears to be regular. LUNGS:  No crackles or wheezing audible.  Respirations even and unlabored.  RADIAL PULSE:  Equal bilaterally.  ABDOMEN:  Soft, nontender.  Bowel sounds present and normal.  No audible abdominal bruit.   EXTREMITIES:  No increased edema present.  DP pulses palpable and equal bilaterally.          Assessment & Plan:  HEALTH  MAINTENANCE.  Physical 03/24/14.  Mammogram 01/28/13 - Birads II.   She declines further mammograms.  Have discussed colonoscopy.  Needs.     I spent 25 minutes with the patient and more than 50% if the time was spent in consultation regarding the above.

## 2014-07-28 NOTE — Patient Instructions (Signed)
Saline nasal spray - flush nose at least 2-3x/day  Nasacort nasal spray - 2 sprays each nostril one time per day.  Do this in the evening.    Eat something for breakfast.   Increase zantac (ranitidine) to twice a day.    Call if cough persists.

## 2014-07-28 NOTE — Progress Notes (Signed)
Pre visit review using our clinic review tool, if applicable. No additional management support is needed unless otherwise documented below in the visit note. 

## 2014-07-29 ENCOUNTER — Encounter: Payer: Self-pay | Admitting: Internal Medicine

## 2014-07-31 ENCOUNTER — Telehealth: Payer: Self-pay | Admitting: Internal Medicine

## 2014-07-31 NOTE — Telephone Encounter (Signed)
Pt notified of lab results via my chart.  Increased Cr.  Stay hydrated.  Recheck within one week.

## 2014-08-02 ENCOUNTER — Encounter: Payer: Self-pay | Admitting: Internal Medicine

## 2014-08-02 DIAGNOSIS — R05 Cough: Secondary | ICD-10-CM | POA: Insufficient documentation

## 2014-08-02 DIAGNOSIS — R059 Cough, unspecified: Secondary | ICD-10-CM | POA: Insufficient documentation

## 2014-08-02 DIAGNOSIS — H00019 Hordeolum externum unspecified eye, unspecified eyelid: Secondary | ICD-10-CM | POA: Insufficient documentation

## 2014-08-02 NOTE — Assessment & Plan Note (Signed)
Persistent dry cough as outlined.  We discussed possible etiologies.  Lungs clear.  Treat allergies.  nasacort and saline nasal spray as outlined.  Robitussin as directed.  Follow.  Also, will increased zantac to bid dosing.  We discussed possibility of ACE inhibitor cough.  Will hold on changing her blood pressure medication.  Try above first.  Follow.  Notify me if persistent.

## 2014-08-02 NOTE — Assessment & Plan Note (Signed)
Off protonix.  Pt prefers not to take.  On Zantac now.  Some occasional break through symptoms.  Increase zantac to bid.  Could be contributing to cough.  Follow.

## 2014-08-02 NOTE — Assessment & Plan Note (Signed)
Better.  Warm compresses.  Follow.

## 2014-08-02 NOTE — Assessment & Plan Note (Signed)
On thyroid replacement.  Follow tsh.  

## 2014-08-02 NOTE — Assessment & Plan Note (Signed)
On lisinopril.  Blood pressure doing well.  Remain off lasix.  Follow.  Follow metabolic panel.   

## 2014-08-02 NOTE — Assessment & Plan Note (Signed)
Previously had intolerance to statin medication.  Low cholesterol diet.  Labs through nephrology.   

## 2014-08-02 NOTE — Assessment & Plan Note (Signed)
Currently stable.  Saw Dr Gollan.  See his note for details.  Stay hydrated.  No further reoccurrence.    

## 2014-08-02 NOTE — Assessment & Plan Note (Signed)
Followed by nephrology.  Labs through their office.  Cr  Usually -1.4 - 1.5.  Follow.   Remain off lasix.  Avoid antiinflammatories and other nephrotoxic drugs.    

## 2014-08-02 NOTE — Assessment & Plan Note (Signed)
Seeing Dr Miller.  S/p surgery 12/15/13.  Still with some pain, but improved.  Continue exercises.      

## 2014-08-13 ENCOUNTER — Encounter: Payer: Self-pay | Admitting: Internal Medicine

## 2014-08-15 LAB — BASIC METABOLIC PANEL
BUN: 32 mg/dL — AB (ref 4–21)
Creatinine: 1.6 mg/dL — AB (ref 0.5–1.1)
Glucose: 115 mg/dL
Potassium: 5.2 mmol/L (ref 3.4–5.3)
Sodium: 140 mmol/L (ref 137–147)

## 2014-08-18 ENCOUNTER — Encounter: Payer: Self-pay | Admitting: Internal Medicine

## 2014-08-18 NOTE — Telephone Encounter (Signed)
Pt states that she can not come in Friday, but doesn't feel that she needs to be seen. She was just giving you an update or her symptoms & she was wanting to tell you that what she is doing currently is not working. Please advise.

## 2014-08-24 ENCOUNTER — Encounter: Payer: Self-pay | Admitting: Internal Medicine

## 2014-08-31 ENCOUNTER — Encounter: Payer: Self-pay | Admitting: *Deleted

## 2014-09-04 ENCOUNTER — Encounter: Payer: Self-pay | Admitting: Internal Medicine

## 2014-09-22 ENCOUNTER — Encounter: Payer: Self-pay | Admitting: Internal Medicine

## 2014-10-06 LAB — BASIC METABOLIC PANEL
BUN: 27 mg/dL — AB (ref 4–21)
CREATININE: 1.3 mg/dL — AB (ref 0.5–1.1)
Glucose: 124 mg/dL
Potassium: 4.7 mmol/L (ref 3.4–5.3)
SODIUM: 139 mmol/L (ref 137–147)

## 2014-10-08 ENCOUNTER — Encounter: Payer: Self-pay | Admitting: Internal Medicine

## 2014-10-08 NOTE — Telephone Encounter (Signed)
Note from Dr. Cherly Hensenhang placed in Scott's box

## 2014-10-15 ENCOUNTER — Telehealth: Payer: Self-pay

## 2014-10-15 ENCOUNTER — Telehealth: Payer: Self-pay | Admitting: Internal Medicine

## 2014-10-15 ENCOUNTER — Encounter: Payer: Self-pay | Admitting: Internal Medicine

## 2014-10-15 NOTE — Telephone Encounter (Signed)
The patient is requesting to speak directly to Dr.Scott.  She is being specific that she does not want to speak with the assistant or anyone besides Dr.Scott.    Callback - [phone]

## 2014-10-15 NOTE — Telephone Encounter (Signed)
Called pt.  See other phone message.

## 2014-10-15 NOTE — Telephone Encounter (Signed)
I tried to call patient back & explain the slip of paper to her that was placed in her letter. The patient told me that I needed to let her talk first (yelling). After patient explain to me that she does not want her mychart deactivated just because she doesn't have time to read it, I tried again (after she finished talking) to explain to her that we would never deactivate her chart without her consent. The patient yelled again "you are not listening" I reminded that patient that we do not have to raise our voices to speak & she said that I needed to stop interrupting her. I tried to finish telling her that part of our protocol is to respond to all unread mychart messages & if it is not urgent, we just mail a letter with a slip inside in case you would like to deactivate the mychart. Pt continued to speakly very loud & rude and I warned that patient that we would not continue this conversation today this way & she was more than welcome to voice her concern with our office manager or with Dr. Lorin PicketScott & then I disconnected the phone call.

## 2014-10-15 NOTE — Telephone Encounter (Signed)
Called pt to discuss the my chart issue and address any concerns.  She does not want her my chart deactivated.  I explained that she would be getting the slip with the notification of unread labs each time a my chart message was not read.  She expressed understanding.  Addressed her questions about her labs and me not being able to see the last set of labs.  She will have them faxed.

## 2014-10-15 NOTE — Telephone Encounter (Signed)
Called pt on phone in response to her phone calls today and to her my chart message.  See phone note.

## 2014-10-15 NOTE — Telephone Encounter (Signed)
Pt called very upset that she received a letter informing her that she had not read a message on MyChart and was asked if she wanted to deactivate her account since she had not read the message. Pt stated that this is the first time that she has be asked to deactivate her account due to not reading a message. Pt stated that she is very busy and doesn't have time to ready her messages everyday and she works 7 day a week mornings and nights. Pt stated that she doesn't wont her account deactivate.

## 2014-10-16 ENCOUNTER — Encounter: Payer: Self-pay | Admitting: Internal Medicine

## 2014-11-27 ENCOUNTER — Ambulatory Visit: Payer: Commercial Managed Care - HMO | Admitting: Internal Medicine

## 2015-01-14 ENCOUNTER — Ambulatory Visit: Payer: Commercial Managed Care - HMO | Admitting: Internal Medicine

## 2015-02-26 NOTE — Discharge Summary (Signed)
PATIENT NAME:  Kristen Bartlett, Marlynn MR#:  [id number] DATE OF BIRTH:  07/14/1941  DATE OF ADMISSION:  02/26/2013 DATE OF DISCHARGE:  02/27/2013  CONSULTATIONS: None.   HOSPITAL COURSE:  211. A 74 year old female patient with history of coronary artery disease, hypertension, hypothyroidism. Came in because of syncopal episode. The patient took Lasix in the morning. Had lunch which was heavy, and then she went to [employer] to shop. There, she felt suddenly dizzy and about to pass out, so the staff called EMS. At the time EMS arrived, the patient's body temperature was 93.5, and she was also found to have elevated creatinine of 1.86. Her baseline creatinine is around 1.5. [facility] The patient was admitted to the hospitalist service for syncope secondary to acute renal failure with dehydration. Started on IV fluids. Lasix was stopped. The patient had some nausea and vomiting when she was coming to the hospital, and vomiting happened by the EMS. The patient's chest x-ray did not show any pneumonia. EKG did not show any acute abnormality. Troponins have been negative. Hemoglobin is around 12.3. The patient was admitted to telemetry. Followed orthostatic vitals, and the lowest blood pressure was 91/30 when she came to the Emergency Room which improved. She also received Reglan and Zofran for nausea. The patient did not have any further dizziness. Did not have orthostatic changes. She was started on IV normal saline at 100 mL an hour. By the time I saw her this afternoon, the patient was eager to go home. She denied any more further episodes of dizziness. I [facility] on call. The patient has a history of thin membrane disease and has renal failure history. The patient's baseline creatinine is around 1.5. [facility] said the patient can follow up with them on May 23rd as scheduled. There is no need to see her earlier  than before. Dr. Shirlee MoreQuincy, on call doctor, recommended not to give Lasix anymore but to continue lisinopril because she had a history of proteinuria and history of nephrotic syndrome before, so I advised the patient to continue lisinopril but not to take Lasix.  2. Chronic renal failure: Baseline creatinine is 1.5, and today, creatinine is 1.69 at the time of discharge. On admission, BUN was 38 and creatinine 1.86.  3. Hypothermia: Reason is unclear. The patient has hypothyroidism. The patient's blood cultures have been negative. Occult sepsis is ruled out. The patient's WBC initially was a little bit up to 17. The patient was started on Levaquin empirically for possible infection, and WBC came back at 12.5. Denies any chest pain or dysuria. The patient is very active and does 2 jobs, and she said she feels much better and does not want to stay in the hospital. Her hypothermia resolved. Temperature initially 93.8 when she came but improved. At the time of discharge today on the vitals, temperature is 98.6. She had a bear hugger. That actually helped her. Blood pressure also improved. Her hypothermia resolved. She did not have any arrhythmias on the monitor.  4. Hypertension: Blood pressure improved anywhere to 117/69. She is advised to continue lisinopril that she takes.  5. She said she had a workup for syncope before, including carotid ultrasound and echocardiogram. All were normal and she did not want any workup done. Told her that she needs workup for her dizziness. If it happens again, .  needs  rpt carotid sono . The patient said she  is following with Dr. Dale [place] and she will inform her and get the test done. 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 Dale [place].   TIME SPENT ON DISCHARGE PREPARATION: More than 30 minutes.   ____________________________ Katha Hamming, MD sk:gb D: 02/27/2013 21:44:42 ET T: 02/28/2013  01:43:02 ET JOB#: [id number]  cc: Katha Hamming, MD, <Dictator> Dale [place], MD Katha Hamming MD ELECTRONICALLY SIGNED 03/12/2013 18:53

## 2015-02-26 NOTE — H&P (Signed)
PATIENT NAME:  Kristen Bartlett, Kristen Bartlett MR#:  [id number] DATE OF BIRTH:  20-Dec-1940  DATE OF ADMISSION:  02/26/2013  PRIMARY CARE PHYSICIAN: Onnie Boer. Sowles, MD   REFERRING PHYSICIAN: Maurilio Lovely, MD  CHIEF COMPLAINT: Syncope today.   HISTORY OF PRESENT ILLNESS: The patient is a 74 year old Caucasian female with past medical history of hypertension, CAD, hypothyroidism, presented to the ED with syncope episode today. The patient is alert, awake, oriented, in no acute distress. The patient got lightheadedness about 2:00 p.m. today and passed out for seconds with loss of consciousness. She felt weak and sick. She had nausea and vomited multiple times, but she denies any fever or chills. No headache. No chest pain, palpitations, orthopnea or nocturnal dyspnea. The patient denies any slurred speech, dysphagia or incontinence. Denies any other injury. The patient said that she was supposed to take Lasix p.r.n., but she did not take it until a few days ago. The patient was noted to have a low body temperature of 93.5 and was treated with a warm blanket. In addition, the patient's creatinine increased to 1.86 from her baseline at 1.4 to 1.5. Dr. Glenetta Hew called the patient's nephrologist at [facility] and they will see the patient tomorrow.   PAST MEDICAL HISTORY: Hypertension, CKD, hypothyroidism.   SURGICAL HISTORY: Right arm tendon release.   SOCIAL HISTORY: No smoking, quit smoking in 1985. No alcohol or drug abuse. The patient is living alone.  FAMILY HISTORY: The patient does not know much about family history.   ALLERGIES: DEMEROL AND SULFA DRUGS.   HOME MEDICATIONS:  1.  Ranitidine 150 mg p.o. daily. 2.  Omega-3.  3.  Multivitamin.  4.  Lisinopril 20 mg p.o. daily. 5.  Levothroid 50 mcg p.o. daily. 6.  Lasix 20 mg p.o. p.r.n.  7.  Calcium 600 Plus D.  8.  Aspirin 325 mg p.o. daily.  9.  Acyclovir 200 mg p.o. once a day.   REVIEW OF SYSTEMS:    CONSTITUTIONAL: The patient denies any fever or  chills. No headache or dizziness, but has generalized weakness and lightheadedness.  EYES: No double vision or blurred vision.  EARS, NOSE, THROAT: No postnasal drip, slurred speech or dysphagia. No epistaxis.  CARDIOVASCULAR: No chest pain, palpitations, orthopnea or nocturnal dyspnea. No leg edema.  PULMONARY: No cough, sputum, shortness of breath or hemoptysis.  GASTROINTESTINAL: No abdominal pain. Has nausea and vomiting. No diarrhea. No melena or bloody stool.  GENITOURINARY: No dysuria, hematuria or incontinence.  SKIN: No rash or jaundice.  HEMATOLOGIC: No easy bruising, bleeding.  ENDOCRINE: No polyuria, polydipsia, heat or cold intolerance.  NEUROLOGIC: Positive for syncope, loss of consciousness, but no seizure.   PHYSICAL EXAMINATION:  VITAL SIGNS: Temperature 97.7 (temperature was 93.5), blood pressure 120/58 (blood pressure was 91/34), pulse 75, O2 saturation 100% on room air.  GENERAL: The patient is alert, awake, oriented, in no acute distress.  HEENT: Pupils round, equal, reactive to light and accommodation. Mild dry oral mucosa. Clear oropharynx.  NECK: Supple. No JVD or carotid bruit. No lymphadenopathy. No thyromegaly.  CARDIOVASCULAR: S1, S2, regular rate and rhythm. No murmurs or gallops.  PULMONARY: Bilateral air entry. No wheezing or rales. No use of accessory muscles to breathe.  ABDOMEN: Soft. No distention or tenderness. No organomegaly. Bowel sounds present.  EXTREMITIES: No edema, clubbing or cyanosis. No calf tenderness. Strong bilateral pedal pulses.  SKIN: No rash or jaundice, but has poor turgor.  NEUROLOGIC: A and O x 3. No focal deficit. Power 5/5. Sensation intact.  LABORATORY, DIAGNOSTIC AND RADIOLOGICAL DATA: Chest x-ray showed no evidence of acute cardiopulmonary disease. Urinalysis is negative. WBC 17.5, hemoglobin 12.3, platelets 273. Glucose 141, BUN 38, creatinine 1.86. Electrolytes are normal. Troponin less than 0.02, CK 69, CK-MB 1.1. EKG showed  normal sinus rhythm at 64 BPM.   IMPRESSIONS:  1.  Syncope, possibly due to acute renal failure, dehydration.  2.  Acute renal failure on chronic kidney disease. 3.  Hypotension.  4.  Hypothermia. 5.  Leukocytosis, possibly due to reaction.  6.  History of hypertension and hypothyroidism.   PLAN OF TREATMENT:  1.  The patient will be admitted to telemetry floor. We will continue telemonitor. We will start normal saline IV and follow up BMP. Followup with [facility] nephrologist tomorrow.  2.  For hypotension, we will hold Lasix and lisinopril and continue normal saline IV. 3.  For leukocytosis, we will follow up CBC and also, we got a blood culture but so far no evidence of sepsis. Hypothermia is possibly due to dehydration and acute renal failure.  4.  For hypothermia, the patient is being treated with a warm blanket and temperature is back to normal range.  5.  I discussed the patient's condition and plan of treatment with the patient. The patient said she is a DNR status and has a legal paper.   TIME SPENT: About 63 minutes.   ____________________________ Shaune PollackQing Travius Crochet, MD qc:jm D: 02/26/2013 20:11:45 ET T: 02/26/2013 20:43:01 ET JOB#: [id number]  cc: Shaune PollackQing Derica Leiber, MD, <Dictator> Shaune PollackQING Trevian Hayashida MD ELECTRONICALLY SIGNED 02/27/2013 16:03

## 2015-02-26 NOTE — Discharge Summary (Signed)
PATIENT NAME:  Kristen Bartlett, Kristen Bartlett MR#:  [id number] DATE OF BIRTH:  26-Apr-1941  DATE OF ADMISSION: 02/26/2013  DATE OF DISCHARGE:  02/27/2013  DISCHARGE DIAGNOSES:  1.  Syncope likely secondary to dehydration.  2.  Acute on chronic renal failure.  3.  Hypothermia.  4.  Hyperthyroidism.  5.  History of shingles.   MEDICATIONS: 1.  Levothyroxine 50 mcg p.o. daily. 2.  Acyclovir 200 mg p.o. daily.  3.  Lisinopril 20 mg p.o. daily.  4.  Calcium with vitamin D 1 tablet daily.  5.  Omega-3 polyunsaturated fatty acids daily.  6.  Ranitidine 150 mg p.o. daily.  7.  Enteric-coated aspirin 81 mg p.o. daily.  DICTATION ENDED HERE.  ____________________________ Katha HammingSnehalatha Beverly Ferner, MD sk:cb D: 02/27/2013 21:46:00 ET T: 02/27/2013 22:35:52 ET JOB#: [id number]  cc: Katha HammingSnehalatha Lynanne Delgreco, MD, <Dictator> Katha HammingSNEHALATHA Lavenia Stumpo MD ELECTRONICALLY SIGNED 03/12/2013 18:54

## 2015-02-27 NOTE — Op Note (Signed)
PATIENT NAME:  Kristen Bartlett, [name] MR#:  [id number] DATE OF BIRTH:  [birth year]  DATE OF PROCEDURE:  12/15/2013  PREOPERATIVE DIAGNOSIS: Right carpal tunnel syndrome.   POSTOPERATIVE DIAGNOSIS: Right carpal tunnel syndrome.   OPERATION: Right carpal tunnel release.   SURGEON: Valinda HoarHoward E. Ashiyah Pavlak, M.D.     ANESTHESIA:  MAC.   COMPLICATIONS: None.   ESTIMATED BLOOD LOSS: None. Replaced none.   DESCRIPTION OF PROCEDURE: The patient was brought to the operating room where she underwent satisfactory MAC anesthesia in the supine position. The right arm was prepped and draped in a sterile fashion and an Esmarch applied. The tourniquet was inflated to 250 mmHg. Tourniquet time was 18 minutes. A longitudinal incision was made in the palm just to the ulnar side of midline. Dissection was carried out bluntly through subcutaneous tissue, and the branches of the median nerve were identified. The distal aspect the volar carpal ligament was identified and a Kelly clamp passed beneath it to protect the nerve. The soft tissues were elevated off the volar aspect of the ligament and a mini blade knife was then used to release the distal two thirds of the ligament. Carpal tunnel scissors were then used to complete the release proximally under direct vision. A mosquito clamp was used to free the nerve up from adhesions. The nerve was compressed and narrowed. The motor branch was identified and was intact. The ulnar nerve and artery were intact. The wound was then irrigated and closed with 4-0 nylon suture. Marcaine 0.5% was placed in the wound and a dry, sterile compression hand dressing and volar splint was applied. The tourniquet was deflated with good return of blood flow to the hand. The patient was awakened and taken to recovery room in good condition.    ____________________________ Valinda HoarHoward E. Jameca Chumley, MD hem:dmm D: 12/15/2013 13:50:27 ET T: 12/15/2013 20:13:54 ET JOB#: [id number]  cc: Valinda HoarHoward E. Declan Mier, MD,  <Dictator> Valinda HoarHOWARD E Fleeta Kunde MD ELECTRONICALLY SIGNED 12/19/2013 14:03

## 2015-04-06 ENCOUNTER — Ambulatory Visit: Payer: Commercial Managed Care - HMO | Admitting: Internal Medicine

## 2015-04-22 NOTE — Telephone Encounter (Signed)
I am closing this encounter since it is from 03/04/13. I am sure it has been taking care of since it is now 2016.

## 2018-02-07 ENCOUNTER — Emergency Department
Admission: EM | Admit: 2018-02-07 | Discharge: 2018-02-07 | Disposition: A | Discharge status: Home / Self Care | Payer: Medicare HMO | Attending: Emergency Medicine | Admitting: Emergency Medicine

## 2018-02-07 ENCOUNTER — Emergency Department: Payer: Medicare HMO

## 2018-02-07 ENCOUNTER — Encounter: Payer: Self-pay | Admitting: Emergency Medicine

## 2018-02-07 DIAGNOSIS — E039 Hypothyroidism, unspecified: Secondary | ICD-10-CM | POA: Insufficient documentation

## 2018-02-07 DIAGNOSIS — S20212A Contusion of left front wall of thorax, initial encounter: Secondary | ICD-10-CM

## 2018-02-07 DIAGNOSIS — S6991XA Unspecified injury of right wrist, hand and finger(s), initial encounter: Secondary | ICD-10-CM | POA: Diagnosis present

## 2018-02-07 DIAGNOSIS — Z79899 Other long term (current) drug therapy: Secondary | ICD-10-CM | POA: Insufficient documentation

## 2018-02-07 DIAGNOSIS — S92401A Displaced unspecified fracture of right great toe, initial encounter for closed fracture: Secondary | ICD-10-CM | POA: Diagnosis not present

## 2018-02-07 DIAGNOSIS — S61216A Laceration without foreign body of right little finger without damage to nail, initial encounter: Secondary | ICD-10-CM | POA: Diagnosis not present

## 2018-02-07 DIAGNOSIS — I10 Essential (primary) hypertension: Secondary | ICD-10-CM | POA: Insufficient documentation

## 2018-02-07 DIAGNOSIS — Y9389 Activity, other specified: Secondary | ICD-10-CM | POA: Insufficient documentation

## 2018-02-07 DIAGNOSIS — Z7982 Long term (current) use of aspirin: Secondary | ICD-10-CM | POA: Insufficient documentation

## 2018-02-07 DIAGNOSIS — Y92410 Unspecified street and highway as the place of occurrence of the external cause: Secondary | ICD-10-CM | POA: Diagnosis not present

## 2018-02-07 DIAGNOSIS — M79642 Pain in left hand: Secondary | ICD-10-CM | POA: Insufficient documentation

## 2018-02-07 DIAGNOSIS — Z87891 Personal history of nicotine dependence: Secondary | ICD-10-CM | POA: Insufficient documentation

## 2018-02-07 DIAGNOSIS — S52002A Unspecified fracture of upper end of left ulna, initial encounter for closed fracture: Secondary | ICD-10-CM

## 2018-02-07 DIAGNOSIS — Y999 Unspecified external cause status: Secondary | ICD-10-CM | POA: Diagnosis not present

## 2018-02-07 MED ORDER — TRAMADOL HCL 50 MG PO TABS
50.0000 mg | ORAL_TABLET | Freq: Once | ORAL | Status: AC
  Administered 2018-02-07: 50 mg via ORAL
  Filled 2018-02-07: qty 1

## 2018-02-07 MED ORDER — TRAMADOL-ACETAMINOPHEN 37.5-325 MG PO TABS: 1.0000 | ORAL_TABLET | Freq: Three times a day (TID) | ORAL | 0 refills | Status: DC | PRN

## 2018-02-07 MED ORDER — LIDOCAINE-EPINEPHRINE-TETRACAINE (LET) SOLUTION
3.0000 mL | Freq: Once | NASAL | Status: AC
  Administered 2018-02-07: 3 mL via TOPICAL
  Filled 2018-02-07: qty 3

## 2018-02-07 MED ORDER — CYCLOBENZAPRINE HCL 10 MG PO TABS: 10.0000 mg | ORAL_TABLET | Freq: Two times a day (BID) | ORAL | 0 refills | Status: DC | PRN

## 2018-02-07 NOTE — ED Provider Notes (Signed)
EKG: Interpreted by me, sinus rhythm the rate 70 bpm, normal PR interval, normal QRS, normal QT. Medical screening examination/treatment/procedure(s) were performed by non-physician practitioner and as supervising physician I was immediately available for consultation/collaboration.      Emily FilbertWilliams, Rosita Guzzetta E, MD 02/07/18 [phone]

## 2018-02-07 NOTE — Discharge Instructions (Signed)
Wear splint and open shoe until evaluation by orthopedic doctor.  Advised medication may cause drowsiness.  No driving or operating machinery while taking medication.  May return back to taking regular Tylenol after completing Ultracet..Marland Kitchen
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
## 2018-02-07 NOTE — ED Notes (Signed)
See triag note.  Pt in wheel chair in nad.  Splint on left arm from ems.

## 2019-06-26 ENCOUNTER — Other Ambulatory Visit: Payer: Self-pay

## 2019-06-26 ENCOUNTER — Inpatient Hospital Stay
Admission: EM | Admit: 2019-06-26 | Discharge: 2019-06-28 | DRG: 603 | Disposition: A | Discharge status: Home / Self Care | Payer: Medicare HMO | Attending: Internal Medicine | Admitting: Internal Medicine

## 2019-06-26 DIAGNOSIS — Z23 Encounter for immunization: Secondary | ICD-10-CM | POA: Diagnosis not present

## 2019-06-26 DIAGNOSIS — Z20828 Contact with and (suspected) exposure to other viral communicable diseases: Secondary | ICD-10-CM | POA: Diagnosis present

## 2019-06-26 DIAGNOSIS — E039 Hypothyroidism, unspecified: Secondary | ICD-10-CM | POA: Diagnosis present

## 2019-06-26 DIAGNOSIS — K219 Gastro-esophageal reflux disease without esophagitis: Secondary | ICD-10-CM | POA: Diagnosis present

## 2019-06-26 DIAGNOSIS — W5501XA Bitten by cat, initial encounter: Secondary | ICD-10-CM | POA: Diagnosis not present

## 2019-06-26 DIAGNOSIS — Z79899 Other long term (current) drug therapy: Secondary | ICD-10-CM | POA: Diagnosis not present

## 2019-06-26 DIAGNOSIS — E860 Dehydration: Secondary | ICD-10-CM | POA: Diagnosis present

## 2019-06-26 DIAGNOSIS — I129 Hypertensive chronic kidney disease with stage 1 through stage 4 chronic kidney disease, or unspecified chronic kidney disease: Secondary | ICD-10-CM | POA: Diagnosis present

## 2019-06-26 DIAGNOSIS — Z79890 Hormone replacement therapy (postmenopausal): Secondary | ICD-10-CM | POA: Diagnosis not present

## 2019-06-26 DIAGNOSIS — Z87891 Personal history of nicotine dependence: Secondary | ICD-10-CM | POA: Diagnosis not present

## 2019-06-26 DIAGNOSIS — E785 Hyperlipidemia, unspecified: Secondary | ICD-10-CM | POA: Diagnosis present

## 2019-06-26 DIAGNOSIS — E7800 Pure hypercholesterolemia, unspecified: Secondary | ICD-10-CM | POA: Diagnosis present

## 2019-06-26 DIAGNOSIS — E871 Hypo-osmolality and hyponatremia: Secondary | ICD-10-CM | POA: Diagnosis present

## 2019-06-26 DIAGNOSIS — Z7982 Long term (current) use of aspirin: Secondary | ICD-10-CM | POA: Diagnosis not present

## 2019-06-26 DIAGNOSIS — N183 Chronic kidney disease, stage 3 (moderate): Secondary | ICD-10-CM | POA: Diagnosis present

## 2019-06-26 DIAGNOSIS — L03113 Cellulitis of right upper limb: Secondary | ICD-10-CM | POA: Diagnosis not present

## 2019-06-26 LAB — CBC WITH DIFFERENTIAL/PLATELET
Abs Immature Granulocytes: 0.05 10*3/uL (ref 0.00–0.07)
Basophils Absolute: 0.1 10*3/uL (ref 0.0–0.1)
Basophils Relative: 1 %
Eosinophils Absolute: 0.2 10*3/uL (ref 0.0–0.5)
Eosinophils Relative: 1 %
HCT: 38 % (ref 36.0–46.0)
Hemoglobin: 12.5 g/dL (ref 12.0–15.0)
Immature Granulocytes: 0 %
Lymphocytes Relative: 21 %
Lymphs Abs: 2.6 10*3/uL (ref 0.7–4.0)
MCH: 31.7 pg (ref 26.0–34.0)
MCHC: 32.9 g/dL (ref 30.0–36.0)
MCV: 96.4 fL (ref 80.0–100.0)
Monocytes Absolute: 1 10*3/uL (ref 0.1–1.0)
Monocytes Relative: 8 %
Neutro Abs: 8.3 10*3/uL — ABNORMAL HIGH (ref 1.7–7.7)
Neutrophils Relative %: 69 %
Platelets: 225 10*3/uL (ref 150–400)
RBC: 3.94 MIL/uL (ref 3.87–5.11)
RDW: 12.6 % (ref 11.5–15.5)
WBC: 12.1 10*3/uL — ABNORMAL HIGH (ref 4.0–10.5)
nRBC: 0 % (ref 0.0–0.2)

## 2019-06-26 LAB — BASIC METABOLIC PANEL
Anion gap: 12 (ref 5–15)
BUN: 35 mg/dL — ABNORMAL HIGH (ref 8–23)
CO2: 21 mmol/L — ABNORMAL LOW (ref 22–32)
Calcium: 9.3 mg/dL (ref 8.9–10.3)
Chloride: 100 mmol/L (ref 98–111)
Creatinine, Ser: 1.69 mg/dL — ABNORMAL HIGH (ref 0.44–1.00)
GFR calc Af Amer: 33 mL/min — ABNORMAL LOW (ref >60)
GFR calc non Af Amer: 28 mL/min — ABNORMAL LOW (ref >60)
Glucose, Bld: 90 mg/dL (ref 70–99)
Potassium: 4.2 mmol/L (ref 3.5–5.1)
Sodium: 133 mmol/L — ABNORMAL LOW (ref 135–145)

## 2019-06-26 LAB — SEDIMENTATION RATE: Sed Rate: 54 mm/hr — ABNORMAL HIGH (ref 0–30)

## 2019-06-26 LAB — SARS CORONAVIRUS 2 BY RT PCR (HOSPITAL ORDER, PERFORMED IN ~~LOC~~ HOSPITAL LAB): SARS Coronavirus 2: NEGATIVE

## 2019-06-26 LAB — C-REACTIVE PROTEIN: CRP: 6 mg/dL — ABNORMAL HIGH (ref <1.0)

## 2019-06-26 MED ORDER — TRAMADOL-ACETAMINOPHEN 37.5-325 MG PO TABS
1.0000 | ORAL_TABLET | Freq: Three times a day (TID) | ORAL | Status: DC | PRN
  Filled 2019-06-26: qty 1

## 2019-06-26 MED ORDER — OMEGA-3-ACID ETHYL ESTERS 1 G PO CAPS
1.0000 | ORAL_CAPSULE | Freq: Two times a day (BID) | ORAL | Status: DC
Start: 2019-06-26 — End: 2019-06-28
  Administered 2019-06-27: 1 g via ORAL
  Filled 2019-06-26 (×2): qty 1

## 2019-06-26 MED ORDER — POLYETHYLENE GLYCOL 3350 17 G PO PACK: 17.0000 g | PACK | Freq: Every day | ORAL | Status: DC | PRN

## 2019-06-26 MED ORDER — TETANUS-DIPHTH-ACELL PERTUSSIS 5-2.5-18.5 LF-MCG/0.5 IM SUSP
0.5000 mL | Freq: Once | INTRAMUSCULAR | Status: AC
  Administered 2019-06-26: 0.5 mL via INTRAMUSCULAR
  Filled 2019-06-26: qty 0.5

## 2019-06-26 MED ORDER — SODIUM CHLORIDE 0.9 % IV SOLN
INTRAVENOUS | Status: DC
  Administered 2019-06-26 – 2019-06-27 (×2): via INTRAVENOUS

## 2019-06-26 MED ORDER — AMLODIPINE BESYLATE 10 MG PO TABS
10.0000 mg | ORAL_TABLET | Freq: Every evening | ORAL | Status: DC
  Administered 2019-06-26 – 2019-06-27 (×2): 10 mg via ORAL
  Filled 2019-06-26 (×2): qty 1

## 2019-06-26 MED ORDER — SODIUM CHLORIDE 0.9 % IV SOLN
3.0000 g | Freq: Two times a day (BID) | INTRAVENOUS | Status: DC
  Administered 2019-06-27 – 2019-06-28 (×3): 3 g via INTRAVENOUS
  Filled 2019-06-26 (×2): qty 3
  Filled 2019-06-26: qty 8
  Filled 2019-06-26: qty 3

## 2019-06-26 MED ORDER — LOSARTAN POTASSIUM 50 MG PO TABS
50.0000 mg | ORAL_TABLET | Freq: Every evening | ORAL | Status: DC
  Administered 2019-06-26 – 2019-06-27 (×2): 50 mg via ORAL
  Filled 2019-06-26 (×2): qty 1

## 2019-06-26 MED ORDER — ONDANSETRON HCL 4 MG/2ML IJ SOLN: 4.0000 mg | Freq: Four times a day (QID) | INTRAMUSCULAR | Status: DC | PRN

## 2019-06-26 MED ORDER — SODIUM CHLORIDE 0.9 % IV SOLN
3.0000 g | Freq: Once | INTRAVENOUS | Status: AC
  Administered 2019-06-26: 3 g via INTRAVENOUS
  Filled 2019-06-26: qty 8

## 2019-06-26 MED ORDER — ASPIRIN EC 81 MG PO TBEC
81.0000 mg | DELAYED_RELEASE_TABLET | Freq: Every day | ORAL | Status: DC
  Administered 2019-06-27: 81 mg via ORAL
  Filled 2019-06-26 (×2): qty 1

## 2019-06-26 MED ORDER — PANTOPRAZOLE SODIUM 40 MG PO TBEC
40.0000 mg | DELAYED_RELEASE_TABLET | Freq: Every day | ORAL | Status: DC
  Administered 2019-06-27 – 2019-06-28 (×2): 40 mg via ORAL
  Filled 2019-06-26 (×2): qty 1

## 2019-06-26 MED ORDER — ACETAMINOPHEN 325 MG PO TABS
650.0000 mg | ORAL_TABLET | Freq: Four times a day (QID) | ORAL | Status: DC | PRN
  Administered 2019-06-27: 650 mg via ORAL
  Filled 2019-06-26: qty 2

## 2019-06-26 MED ORDER — ACETAMINOPHEN 650 MG RE SUPP: 650.0000 mg | Freq: Four times a day (QID) | RECTAL | Status: DC | PRN

## 2019-06-26 MED ORDER — ONDANSETRON HCL 4 MG PO TABS: 4.0000 mg | ORAL_TABLET | Freq: Four times a day (QID) | ORAL | Status: DC | PRN

## 2019-06-26 MED ORDER — ACYCLOVIR 200 MG PO CAPS
200.0000 mg | ORAL_CAPSULE | Freq: Every day | ORAL | Status: DC
  Administered 2019-06-27 – 2019-06-28 (×2): 200 mg via ORAL
  Filled 2019-06-26 (×2): qty 1

## 2019-06-26 MED ORDER — FAMOTIDINE 20 MG PO TABS: 20.0000 mg | ORAL_TABLET | ORAL | Status: DC

## 2019-06-26 NOTE — Consult Note (Signed)
Pharmacy Antibiotic Note  Kristen Bartlett is a 78 y.o. female admitted on 06/26/2019 with cat bite and cellulitis.  Pharmacy has been consulted for Unasyn dosing.  Plan: Unasyn 3g Q12H. Will follow Scr with AM labs.    Height: 5\' 1"  (154.9 cm) Weight: 130 lb (59 kg) IBW/kg (Calculated) : 47.8  Temp (24hrs), Avg:98.4 F (36.9 C), Min:98.2 F (36.8 C), Max:98.6 F (37 C)  Recent Labs  Lab 06/26/19 1526  WBC 12.1*  CREATININE 1.69*    Estimated Creatinine Clearance: 22.3 mL/min (A) (by C-G formula based on SCr of 1.69 mg/dL (H)).    Allergies  Allergen Reactions  . Statins     Muscular  Other reaction(s): Myalgias (Muscle Pain)  . Lisinopril     Other reaction(s): Other (See Comments) cough  . Pravastatin Other (See Comments)  . Rosuvastatin Calcium     Muscular   . Meperidine Rash  . Sulfa Antibiotics Rash    Other reaction(s): Other (See Comments)    Antimicrobials this admission: 8/20 Unasyn >>   Microbiology results: N/A  Thank you for allowing pharmacy to be a part of this patient's care.  Rowland Lathe 06/26/2019 7:16 PM

## 2019-06-26 NOTE — H&P (Signed)
[facility] [place] [facility]   PATIENT NAME: Kristen Bartlett    MR#:  [id number]  DATE OF BIRTH:  Mar 09, 1941  DATE OF ADMISSION:  06/26/2019  PRIMARY CARE PHYSICIAN: Rosemarie Axobertson, Noelle, MD   REQUESTING/REFERRING PHYSICIAN: Artis DelayMary Funke, MD  CHIEF COMPLAINT:   Chief Complaint  Patient presents with  . Animal Bite    HISTORY OF PRESENT ILLNESS:  Kristen Bartlett  is a 78 y.o. female with a known history of hypertension, hyperlipidemia, hypothyroidism who presented to the ED with right hand erythema and swelling after a cat bite yesterday.  Patient states she was trying to put the cat and the cat carrier to take her to the vet to be put down, when the cat "nipped her".  She noticed some erythema and warmth a couple hours later.  She called her PCP, who started her on Augmentin.  When she woke up this morning, the erythema had improved.  Throughout the course of the day today, however, she has had worsening erythema, edema, and pain.  She denies any fevers or chills.  In the ED, vitals were unremarkable.  Labs were significant for creatinine 1.69, WBC 12.1, sodium 133.  ED physician performed an ultrasound and did not see an abscess.  She was given Unasyn.  Hospitalists were called for admission.  PAST MEDICAL HISTORY:   Past Medical History:  Diagnosis Date  . Arthritis   . GERD (gastroesophageal reflux disease)   . Heart murmur   . Hypercholesterolemia   . Hypertension   . Hypothyroidism   . Syncope and collapse   . Thin basement membrane disease   . Thin basement membrane disease     PAST SURGICAL HISTORY:   Past Surgical History:  Procedure Laterality Date  . TENDON RELEASE     right arm   . TONSILLECTOMY  1955    SOCIAL HISTORY:   Social History   Tobacco Use  . Smoking status: Former Smoker    Packs/day: 2.00    Years: 30.00    Pack years: 60.00    Types: Cigarettes    Quit date: 12/09/1983    Years since quitting: 35.5  . Smokeless  tobacco: Never Used  Substance Use Topics  . Alcohol use: Yes    Comment: occasional glass of wine    FAMILY HISTORY:   Family History  Problem Relation Age of Onset  . Arthritis Mother   . Diabetes Father   . Heart disease Father     DRUG ALLERGIES:   Allergies  Allergen Reactions  . Statins     Muscular  Other reaction(s): Myalgias (Muscle Pain)  . Lisinopril     Other reaction(s): Other (See Comments) cough  . Pravastatin Other (See Comments)  . Rosuvastatin Calcium     Muscular   . Meperidine Rash  . Sulfa Antibiotics Rash    Other reaction(s): Other (See Comments)    REVIEW OF SYSTEMS:   Review of Systems  Constitutional: Negative for chills and fever.  HENT: Negative for congestion and sore throat.   Eyes: Negative for blurred vision and double vision.  Respiratory: Negative for cough and shortness of breath.   Cardiovascular: Negative for chest pain and palpitations.  Gastrointestinal: Negative for nausea and vomiting.  Genitourinary: Negative for dysuria and urgency.  Musculoskeletal: Positive for joint pain. Negative for back pain and neck pain.  Neurological: Negative for dizziness and headaches.  Psychiatric/Behavioral: Negative for depression. The patient is not nervous/anxious.  MEDICATIONS AT HOME:   Prior to Admission medications   Medication Sig Start Date End Date Taking? Authorizing Provider  amLODipine (NORVASC) 10 MG tablet Take 10 mg by mouth daily. 06/09/19 06/08/20 Yes [provider]  amoxicillin-clavulanate (AUGMENTIN) 875-125 MG tablet Take 1 tablet by mouth 2 (two) times daily. For 7 days 06/25/19 07/02/19 Yes [provider]  acyclovir (ZOVIRAX) 200 MG capsule Take 1 capsule (200 mg total) by mouth daily. Patient not taking: Reported on 06/26/2019 07/28/14   Einar Pheasant, MD  aspirin 81 MG tablet Take 81 mg by mouth daily.    [provider]  Calcium Carb-Cholecalciferol (CALCIUM + D3 PO) Take 600 mg by  mouth 2 (two) times daily.    [provider]  cyclobenzaprine (FLEXERIL) 10 MG tablet Take 1 tablet (10 mg total) by mouth 2 (two) times daily as needed. Patient not taking: Reported on 06/26/2019 02/07/18   Sable Feil, PA-C  famotidine (PEPCID) 20 MG tablet Take 20 mg by mouth 2 (two) times daily. 03/20/19   [provider]  furosemide (LASIX) 20 MG tablet Take 20 mg by mouth daily as needed.    [provider]  levothyroxine (SYNTHROID, LEVOTHROID) 50 MCG tablet Take 1 tablet (50 mcg total) by mouth daily. 06/11/14   Einar Pheasant, MD  lisinopril (PRINIVIL,ZESTRIL) 20 MG tablet Take 1 tablet (20 mg total) by mouth daily. 02/02/14   Einar Pheasant, MD  losartan (COZAAR) 50 MG tablet Take 50 mg by mouth daily. 04/29/19   [provider]  Multiple Vitamin (MULTIVITAMIN) tablet Take 1 tablet by mouth daily.    [provider]  omega-3 fish oil (MAXEPA) 1000 MG CAPS capsule Take 1 capsule by mouth 2 (two) times daily.     [provider]  pantoprazole (PROTONIX) 40 MG tablet Take 40 mg by mouth daily.    [provider]  ranitidine (ZANTAC) 150 MG capsule Take 150 mg by mouth daily.    [provider]  traMADol-acetaminophen (ULTRACET) 37.5-325 MG tablet Take 1 tablet by mouth every 8 (eight) hours as needed. Patient not taking: Reported on 06/26/2019 02/07/18   Sable Feil, PA-C      VITAL SIGNS:  Blood pressure (!) 150/85, pulse 82, temperature 98.2 F (36.8 C), temperature source Oral, resp. rate 16, height 5\' 1"  (1.549 m), weight 59 kg, last menstrual period 01/18/1984, SpO2 99 %.  PHYSICAL EXAMINATION:  Physical Exam  GENERAL:  78 y.o.-year-old patient lying in the bed with no acute distress.  EYES: Pupils equal, round, reactive to light and accommodation. No scleral icterus. Extraocular muscles intact.  HEENT: Head atraumatic, normocephalic. Oropharynx and nasopharynx clear.  NECK:  Supple, no jugular venous  distention. No thyroid enlargement, no tenderness.  LUNGS: Normal breath sounds bilaterally, no wheezing, rales,rhonchi or crepitation. No use of accessory muscles of respiration.  CARDIOVASCULAR: RRR, S1, S2 normal. No murmurs, rubs, or gallops.  ABDOMEN: Soft, nontender, nondistended. Bowel sounds present. No organomegaly or mass.  EXTREMITIES: No pedal edema, cyanosis, or clubbing. + Erythema, edema, and warmth of the right hand with streaking up the forearm to the level of the elbow.  See picture below. NEUROLOGIC: Cranial nerves II through XII are intact. Muscle strength 5/5 in all extremities. Sensation intact. Gait not checked.  PSYCHIATRIC: The patient is alert and oriented x 3.  SKIN: No obvious rash, lesion, or ulcer.        LABORATORY PANEL:   CBC Recent Labs  Lab 06/26/19 1526  WBC 12.1*  HGB 12.5  HCT 38.0  PLT 225   ------------------------------------------------------------------------------------------------------------------  Chemistries  Recent Labs  Lab 06/26/19 1526  NA 133*  K 4.2  CL 100  CO2 21*  GLUCOSE 90  BUN 35*  CREATININE 1.69*  CALCIUM 9.3   ------------------------------------------------------------------------------------------------------------------  Cardiac Enzymes No results for input(s): TROPONINI in the last 168 hours. ------------------------------------------------------------------------------------------------------------------  RADIOLOGY:  No results found.    IMPRESSION AND PLAN:   Right hand cellulitis after cat bite- no signs of sepsis.  ED physician performed ultrasound and did not find an abscess. -Received Tdap in the ED -Continue Unasyn -Ortho consult -If no improvement overnight, would obtain CT hand in the morning  Hyponatremia- may be due to some mild dehydration -Will place on gentle IVFs overnight -Recheck sodium in the morning  Hypertension- BP normal in the ED -Continue home Norvasc and losartan   Hypothyroidism- stable -Continue home Synthroid  CKD III- creatinine at baseline -Avoid nephrotoxic agents -Monitor  All the records are reviewed and case discussed with ED provider. Management plans discussed with the patient, family and they are in agreement.  CODE STATUS: Full  TOTAL TIME TAKING CARE OF THIS PATIENT: 45 minutes.    Jinny BlossomKaty D Rahmon Heigl M.D on 06/26/2019 at 5:45 PM  Between 7am to 6pm - Pager [phone]- [phone]  After 6pm go to [email or web address] - Social research officer, governmentpassword [email or web address]  [facility] [place] [facility]  Office  [phone]  CC: Primary care physician; Rosemarie Axobertson, Noelle, MD   Note: This dictation was prepared with Dragon dictation along with smaller phrase technology. Any transcriptional errors that result from this process are unintentional.

## 2019-06-26 NOTE — ED Triage Notes (Signed)
Patient presents to the ED with cat bite to right hand that occurred yesterday. Cat has since been put down due to illness.  Patient's right hand appears reddened and swollen with red streaking up her right arm.  Patient is alert and oriented x 4.  No obvious distress at this time.  Patient is very talkative.

## 2019-06-26 NOTE — ED Provider Notes (Signed)
[facility] Emergency Department Provider Note  ____________________________________________   First MD Initiated Contact with Patient 06/26/19 1537     (approximate)  I have reviewed the triage vital signs and the nursing notes.   HISTORY  Chief Complaint Animal Bite    HPI Phoebe PerchMaggie Valek is a 78 y.o. female with hypertension who presents with cat bite.  Patient had a cat bite to the right hand that occurred yesterday.  Patient noticed some redness around the puncture site yesterday around 1030.  She was put on some topical antibiotics given by her that but then had a conversation with her primary care doctor when the swelling got worse and they started her on Augmentin.  She took 1 dose yesterday and 1 dose today however the redness has continued to rapidly spread.  The redness has spread up her arm which she now has a streak almost going to her elbow.  She still has full range motion of her joints.  She is not immunosuppressed.  She denies fevers or nausea.  The pain is mild, constant, nothing is better or worse.          Past Medical History:  Diagnosis Date  . Arthritis   . GERD (gastroesophageal reflux disease)   . Heart murmur   . Hypercholesterolemia   . Hypertension   . Hypothyroidism   . Syncope and collapse   . Thin basement membrane disease   . Thin basement membrane disease     Patient Active Problem List   Diagnosis Date Noted  . Cough 08/02/2014  . Sty 08/02/2014  . Hip pain, acute 10/05/2013  . Carpal tunnel syndrome 07/26/2013  . Syncope 03/27/2013  . Essential hypertension, benign 01/19/2013  . GERD (gastroesophageal reflux disease) 01/19/2013  . Hypercholesterolemia 01/19/2013  . 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 Hypothyroidism 01/19/2013    Past Surgical History:  Procedure Laterality Date  . TENDON RELEASE     right arm   . TONSILLECTOMY  1955    Prior to Admission medications   Medication Sig Start  Date End Date Taking? Authorizing Provider  acyclovir (ZOVIRAX) 200 MG capsule Take 1 capsule (200 mg total) by mouth daily. 07/28/14   Dale DurhamScott, Charlene, MD  aspirin 81 MG tablet Take 81 mg by mouth daily.    [provider]  Calcium Carb-Cholecalciferol (CALCIUM + D3 PO) Take 600 mg by mouth 2 (two) times daily.    [provider]  cyclobenzaprine (FLEXERIL) 10 MG tablet Take 1 tablet (10 mg total) by mouth 2 (two) times daily as needed. 02/07/18   Joni ReiningSmith, Ronald K, PA-C  furosemide (LASIX) 20 MG tablet Take 20 mg by mouth daily as needed.    [provider]  levothyroxine (SYNTHROID, LEVOTHROID) 50 MCG tablet Take 1 tablet (50 mcg total) by mouth daily. 06/11/14   Dale DurhamScott, Charlene, MD  lisinopril (PRINIVIL,ZESTRIL) 20 MG tablet Take 1 tablet (20 mg total) by mouth daily. 02/02/14   Dale DurhamScott, Charlene, MD  Multiple Vitamin (MULTIVITAMIN) tablet Take 1 tablet by mouth daily.    [provider]  omega-3 fish oil (MAXEPA) 1000 MG CAPS capsule Take 1 capsule by mouth 2 (two) times daily.     [provider]  ranitidine (ZANTAC) 150 MG capsule Take 150 mg by mouth daily.    [provider]  traMADol-acetaminophen (ULTRACET) 37.5-325 MG tablet Take 1 tablet by mouth every 8 (eight) hours as needed. 02/07/18   Joni ReiningSmith, Ronald K, PA-C  Allergies Lisinopril, Statins, Demerol [meperidine], and Sulfa antibiotics  Family History  Problem Relation Age of Onset  . Arthritis Mother   . Diabetes Father   . Heart disease Father     Social History Social History   Tobacco Use  . Smoking status: Former Smoker    Packs/day: 2.00    Years: 30.00    Pack years: 60.00    Types: Cigarettes    Quit date: 12/09/1983    Years since quitting: 35.5  . Smokeless tobacco: Never Used  Substance Use Topics  . Alcohol use: Yes    Comment: occasional glass of wine  . Drug use: No      Review of Systems Constitutional: No fever/chills Eyes: No visual changes. ENT: No  sore throat. Cardiovascular: Denies chest pain. Respiratory: Denies shortness of breath. Gastrointestinal: No abdominal pain.  No nausea, no vomiting.  No diarrhea.  No constipation. Genitourinary: Negative for dysuria. Musculoskeletal: Negative for back pain.  Positive swelling of the right hand Skin: Negative for rash. Neurological: Negative for headaches, focal weakness or numbness. All other ROS negative ____________________________________________   PHYSICAL EXAM:  VITAL SIGNS: ED Triage Vitals [06/26/19 1532]  Enc Vitals Group     BP (!) 150/85     Pulse Rate 82     Resp 16     Temp 98.2 F (36.8 C)     Temp Source Oral     SpO2 99 %     Weight      Height      Head Circumference      Peak Flow      Pain Score      Pain Loc      Pain Edu?      Excl. in GC?     Constitutional: Alert and oriented. Well appearing and in no acute distress. Eyes: Conjunctivae are normal. EOMI. Head: Atraumatic. Nose: No congestion/rhinnorhea. Mouth/Throat: Mucous membranes are moist.   Neck: No stridor. Trachea Midline. FROM Cardiovascular: Normal rate, regular rhythm. Grossly normal heart sounds.  Good peripheral circulation. Respiratory: Normal respiratory effort.  No retractions. Lungs CTAB. Gastrointestinal: Soft and nontender. No distention. No abdominal bruits.  Musculoskeletal: Redness and swelling of the right thumb but still has full range of motion redness spreading up into the forearm and they continue to streak up into the elbow Neurologic:  Normal speech and language. No gross focal neurologic deficits are appreciated.  Skin:  Skin is warm, dry and intact. No rash noted. Psychiatric: Mood and affect are normal. Speech and behavior are normal. GU: Deferred   ____________________________________________   LABS (all labs ordered are listed, but only abnormal results are displayed)  Labs Reviewed  CBC WITH DIFFERENTIAL/PLATELET - Abnormal; Notable for the following  components:      Result Value   WBC 12.1 (*)    Neutro Abs 8.3 (*)    All other components within normal limits  BASIC METABOLIC PANEL - Abnormal; Notable for the following components:   Sodium 133 (*)    CO2 21 (*)    BUN 35 (*)    Creatinine, Ser 1.69 (*)    GFR calc non Af Amer 28 (*)    GFR calc Af Amer 33 (*)    All other components within normal limits  SEDIMENTATION RATE - Abnormal; Notable for the following components:   Sed Rate 54 (*)    All other components within normal limits  SARS CORONAVIRUS 2 (HOSPITAL ORDER, PERFORMED IN [facility])  C-REACTIVE PROTEIN   ____________________________________________  PROCEDURES  Procedure(s) performed (including Critical Care):  Procedures   ____________________________________________   INITIAL IMPRESSION / ASSESSMENT AND PLAN / ED COURSE  Vernida Mcnicholas was evaluated in Emergency Department on 06/26/2019 for the symptoms described in the history of present illness. She was evaluated in the context of the global COVID-19 pandemic, which necessitated consideration that the patient might be at risk for infection with the SARS-CoV-2 virus that causes COVID-19. Institutional protocols and algorithms that pertain to the evaluation of patients at risk for COVID-19 are in a state of rapid change based on information released by regulatory bodies including the CDC and federal and state organizations. These policies and algorithms were followed during the patient's care in the ED.     Patient presents with a wound from a cat bite that looks consistent with cellulitis.  Her exam also shows lymphadenitis extending from the hand up past the elbow.  Given the rapid progression of the infection will get labs to evaluate for CRP, sed rate.  No crepitus and low suspicion for necrotizing fasciitis at this time but will need to carefully monitor.  Will do bedside ultrasound to evaluate for abscess but do not feel any fluctuation upon  examination.  Low suspicion for retained products given the mechanism of the wound so will not get x-ray..  Patient does not meet sepsis criteria so low suspicion for bacteremia  5:10 PM ultrasound not show any evidence of abscess.  Patient started on Unasyn.  White count is slightly elevated as well as her sed rate of 54.  Swelling was marked.  Discussed the hospital team for admission given the continued progression even with oral antibiotics.  Unclear if this is treatment failure just that she has not been on the antibiotics long enough but given patient's concern we will continue IV treatment to ensure that it is getting better before patient going home.        ____________________________________________   FINAL CLINICAL IMPRESSION(S) / ED DIAGNOSES   Final diagnoses:  Cat bite, initial encounter  Cellulitis of right upper extremity      MEDICATIONS GIVEN DURING THIS VISIT:  Medications  Tdap (BOOSTRIX) injection 0.5 mL (has no administration in time range)  Ampicillin-Sulbactam (UNASYN) 3 g in sodium chloride 0.9 % 100 mL IVPB ( Intravenous Stopped 06/26/19 1646)     ED Discharge Orders    None       Note:  This document was prepared using Dragon voice recognition software and may include unintentional dictation errors.   Vanessa Luther, MD 06/26/19 [phone]

## 2019-06-26 NOTE — ED Notes (Signed)
Patient provided with food

## 2019-06-26 NOTE — Progress Notes (Signed)
Family Meeting Note  Advance Directive:yes  Today a meeting took place with the Patient.  Patient is able to participate.  The following clinical team members were present during this meeting:MD  The following were discussed:Patient's diagnosis: right hand cellulitis, Patient's progosis: Unable to determine and Goals for treatment: Full Code  Additional follow-up to be provided: prn  Time spent during discussion:20 minutes  Evette Doffing, MD

## 2019-06-26 NOTE — ED Notes (Signed)
ED TO INPATIENT HANDOFF REPORT  ED Nurse Name and Phone #: Tobi Bastosnna [phone]  S Name/Age/Gender Kristen PerchMaggie Housewright 78 y.o. female Room/Bed: ED12HA/ED12HA  Code Status   Code Status: Not on file  Home/SNF/Other Home Patient oriented to: self, place, time and situation Is this baseline? Yes   Triage Complete: Triage complete  Chief Complaint CAT BITE  Triage Note Patient presents to the ED with cat bite to right hand that occurred yesterday. Cat has since been put down due to illness.  Patient's right hand appears reddened and swollen with red streaking up her right arm.  Patient is alert and oriented x 4.  No obvious distress at this time.  Patient is very talkative.     Allergies Allergies  Allergen Reactions  . Statins     Muscular  Other reaction(s): Myalgias (Muscle Pain)  . Lisinopril     Other reaction(s): Other (See Comments) cough  . Pravastatin Other (See Comments)  . Rosuvastatin Calcium     Muscular   . Meperidine Rash  . Sulfa Antibiotics Rash    Other reaction(s): Other (See Comments)    Level of Care/Admitting Diagnosis ED Disposition    ED Disposition Condition Comment   Admit  The patient appears reasonably stabilized for admission considering the current resources, flow, and capabilities available in the ED at this time, and I doubt any other [facility] further screening and/or treatment in the ED prior to admission is  present.       B Medical/Surgery History Past Medical History:  Diagnosis Date  . Arthritis   . GERD (gastroesophageal reflux disease)   . Heart murmur   . Hypercholesterolemia   . Hypertension   . Hypothyroidism   . Syncope and collapse   . Thin basement membrane disease   . Thin basement membrane disease    Past Surgical History:  Procedure Laterality Date  . TENDON RELEASE     right arm   . TONSILLECTOMY  1955     A IV Location/Drains/Wounds Patient Lines/Drains/Airways Status   Active Line/Drains/Airways    Name:    Placement date:   Placement time:   Site:   Days:   Peripheral IV 06/26/19 Left Arm   06/26/19    1556    Arm   less than 1          Intake/Output Last 24 hours  Intake/Output Summary (Last 24 hours) at 06/26/2019 1727 Last data filed at 06/26/2019 1712 Gross per 24 hour  Intake 100 ml  Output -  Net 100 ml    Labs/Imaging Results for orders placed or performed during the hospital encounter of 06/26/19 (from the past 48 hour(s))  CBC with Differential     Status: Abnormal   Collection Time: 06/26/19  3:26 PM  Result Value Ref Range   WBC 12.1 (H) 4.0 - 10.5 K/uL   RBC 3.94 3.87 - 5.11 MIL/uL   Hemoglobin 12.5 12.0 - 15.0 g/dL   HCT 16.138.0 09.636.0 - 04.546.0 %   MCV 96.4 80.0 - 100.0 fL   MCH 31.7 26.0 - 34.0 pg   MCHC 32.9 30.0 - 36.0 g/dL   RDW 40.912.6 81.111.5 - 91.415.5 %   Platelets 225 150 - 400 K/uL   nRBC 0.0 0.0 - 0.2 %   Neutrophils Relative % 69 %   Neutro Abs 8.3 (H) 1.7 - 7.7 K/uL   Lymphocytes Relative 21 %   Lymphs Abs 2.6 0.7 - 4.0 K/uL   Monocytes Relative 8 %  Monocytes Absolute 1.0 0.1 - 1.0 K/uL   Eosinophils Relative 1 %   Eosinophils Absolute 0.2 0.0 - 0.5 K/uL   Basophils Relative 1 %   Basophils Absolute 0.1 0.0 - 0.1 K/uL   Immature Granulocytes 0 %   Abs Immature Granulocytes 0.05 0.00 - 0.07 K/uL    Comment: Performed at [facility], [address] [place], [place] [address]  Basic metabolic panel     Status: Abnormal   Collection Time: 06/26/19  3:26 PM  Result Value Ref Range   Sodium 133 (L) 135 - 145 mmol/L   Potassium 4.2 3.5 - 5.1 mmol/L   Chloride 100 98 - 111 mmol/L   CO2 21 (L) 22 - 32 mmol/L   Glucose, Bld 90 70 - 99 mg/dL   BUN 35 (H) 8 - 23 mg/dL   Creatinine, Ser 1.69 (H) 0.44 - 1.00 mg/dL   Calcium 9.3 8.9 - 10.3 mg/dL   GFR calc non Af Amer 28 (L) >60 mL/min   GFR calc Af Amer 33 (L) >60 mL/min   Anion gap 12 5 - 15    Comment: Performed at [facility], [address] [place], [place] [address]  Sedimentation rate      Status: Abnormal   Collection Time: 06/26/19  3:26 PM  Result Value Ref Range   Sed Rate 54 (H) 0 - 30 mm/hr    Comment: Performed at [facility], [address], [place],  [address]   No results found.  Pending Labs [employer] (From admission, onward)    Start     Ordered   06/26/19 1716  SARS Coronavirus 2 [facility] order, Performed in [facility] hospital lab) Nasopharyngeal Nasopharyngeal Swab  (Symptomatic/High Risk of Exposure/Tier 1 Patients Labs with Precautions)  Once,   STAT    Question Answer Comment  Is this test for diagnosis or screening Diagnosis of ill patient   Symptomatic for COVID-19 as defined by CDC Yes   Date of Symptom Onset 06/26/2019   Hospitalized for COVID-19 Yes   Admitted to ICU for COVID-19 No   Previously tested for COVID-19 No   Resident in a congregate (group) care setting No   Employed in healthcare setting No   Pregnant No      06/26/19 1715   06/26/19 1546  C-reactive protein  Once,   STAT     06/26/19 1545          Vitals/Pain Today's Vitals   06/26/19 1532 06/26/19 1553 06/26/19 1554  BP: (!) 150/85    Pulse: 82    Resp: 16    Temp: 98.2 F (36.8 C)    TempSrc: Oral    SpO2: 99%    Weight:   59 kg  Height:   5\' 1"  (1.549 m)  PainSc:  5      Isolation Precautions No active isolations  Medications Medications  Tdap (BOOSTRIX) injection 0.5 mL (has no administration in time range)  Ampicillin-Sulbactam (UNASYN) 3 g in sodium chloride 0.9 % 100 mL IVPB ( Intravenous Stopped 06/26/19 1646)    Mobility walks Low fall risk   Focused Assessments SKin   R Recommendations: See Admitting Provider Note  Report given to:   Additional Notes:

## 2019-06-27 LAB — CBC
HCT: 35.7 % — ABNORMAL LOW (ref 36.0–46.0)
Hemoglobin: 11.8 g/dL — ABNORMAL LOW (ref 12.0–15.0)
MCH: 31.6 pg (ref 26.0–34.0)
MCHC: 33.1 g/dL (ref 30.0–36.0)
MCV: 95.7 fL (ref 80.0–100.0)
Platelets: 211 10*3/uL (ref 150–400)
RBC: 3.73 MIL/uL — ABNORMAL LOW (ref 3.87–5.11)
RDW: 12.5 % (ref 11.5–15.5)
WBC: 7.8 10*3/uL (ref 4.0–10.5)
nRBC: 0 % (ref 0.0–0.2)

## 2019-06-27 LAB — BASIC METABOLIC PANEL
Anion gap: 8 (ref 5–15)
BUN: 27 mg/dL — ABNORMAL HIGH (ref 8–23)
CO2: 22 mmol/L (ref 22–32)
Calcium: 8.7 mg/dL — ABNORMAL LOW (ref 8.9–10.3)
Chloride: 107 mmol/L (ref 98–111)
Creatinine, Ser: 1.54 mg/dL — ABNORMAL HIGH (ref 0.44–1.00)
GFR calc Af Amer: 37 mL/min — ABNORMAL LOW (ref >60)
GFR calc non Af Amer: 32 mL/min — ABNORMAL LOW (ref >60)
Glucose, Bld: 87 mg/dL (ref 70–99)
Potassium: 3.9 mmol/L (ref 3.5–5.1)
Sodium: 137 mmol/L (ref 135–145)

## 2019-06-27 MED ORDER — SACCHAROMYCES BOULARDII 250 MG PO CAPS
250.0000 mg | ORAL_CAPSULE | Freq: Two times a day (BID) | ORAL | Status: DC
  Administered 2019-06-27 – 2019-06-28 (×2): 250 mg via ORAL
  Filled 2019-06-27 (×2): qty 1

## 2019-06-27 MED ORDER — LEVOTHYROXINE SODIUM 50 MCG PO TABS
50.0000 ug | ORAL_TABLET | Freq: Every day | ORAL | Status: DC
  Administered 2019-06-28: 50 ug via ORAL
  Filled 2019-06-27: qty 1

## 2019-06-27 NOTE — Consult Note (Signed)
ORTHOPAEDIC CONSULTATION  REQUESTING PHYSICIAN: Demetrios Loll, MD  Chief Complaint: Right hand cat bite  HPI: Kristen Bartlett is a 78 y.o. female who complains of pain and swelling in the right hand after being bitten by her cat on Wednesday while trying to place the cat in a cat carrier.  Patient started Augmentin given to her by her PCP and initially responded but then the erythema spread.  She was told to come to the emergency department.  She was admitted for IV Unasyn by the hospital service overnight.  Orthopedics is consulted for evaluation of the patient's hand.  Today in the bed the patient denies any significant pain.  She has painless movement to her thumb and digits.  Past Medical History:  Diagnosis Date  . Arthritis   . GERD (gastroesophageal reflux disease)   . Heart murmur   . Hypercholesterolemia   . Hypertension   . Hypothyroidism   . Syncope and collapse   . Thin basement membrane disease   . Thin basement membrane disease    Past Surgical History:  Procedure Laterality Date  . TENDON RELEASE     right arm   . TONSILLECTOMY  1955   Social History   Socioeconomic History  . Marital status: Divorced    Spouse name: Not on file  . Number of children: Not on file  . Years of education: Not on file  . Highest education level: Not on file  Occupational History  . Not on file  Social Needs  . Financial resource strain: Not on file  . Food insecurity    Worry: Not on file    Inability: Not on file  . Transportation needs    Medical: Not on file    Non-medical: Not on file  Tobacco Use  . Smoking status: Former Smoker    Packs/day: 2.00    Years: 30.00    Pack years: 60.00    Types: Cigarettes    Quit date: 12/09/1983    Years since quitting: 35.5  . Smokeless tobacco: Never Used  Substance and Sexual Activity  . Alcohol use: Yes    Comment: occasional glass of wine  . Drug use: No  . Sexual activity: Not on file  Lifestyle  . Physical activity   Days per week: Not on file    Minutes per session: Not on file  . Stress: Not on file  Relationships  . Social Herbalist on phone: Not on file    Gets together: Not on file    Attends religious service: Not on file    Active member of club or organization: Not on file    Attends meetings of clubs or organizations: Not on file    Relationship status: Not on file  Other Topics Concern  . Not on file  Social History Narrative  . Not on file   Family History  Problem Relation Age of Onset  . Arthritis Mother   . Diabetes Father   . Heart disease Father    Allergies  Allergen Reactions  . Statins     Muscular  Other reaction(s): Myalgias (Muscle Pain)  . Lisinopril     Other reaction(s): Other (See Comments) cough  . Pravastatin Other (See Comments)  . Rosuvastatin Calcium     Muscular   . Meperidine Rash  . Sulfa Antibiotics Rash    Other reaction(s): Other (See Comments)   Prior to Admission medications   Medication Sig Start Date End Date Taking?  Authorizing Provider  acetaminophen (TYLENOL) 500 MG tablet Take 1,500 mg by mouth every 6 (six) hours as needed for moderate pain.   Yes [provider]  acyclovir (ZOVIRAX) 200 MG capsule Take 1 capsule (200 mg total) by mouth daily. 07/28/14  Yes Dale DurhamScott, Charlene, MD  amLODipine (NORVASC) 10 MG tablet Take 10 mg by mouth daily. 06/09/19 06/08/20 Yes [provider]  amoxicillin-clavulanate (AUGMENTIN) 875-125 MG tablet Take 1 tablet by mouth 2 (two) times daily. For 7 days 06/25/19 07/02/19 Yes [provider]  famotidine (PEPCID) 20 MG tablet Take 20 mg by mouth 2 (two) times daily. 03/20/19  Yes [provider]  levothyroxine (SYNTHROID, LEVOTHROID) 50 MCG tablet Take 1 tablet (50 mcg total) by mouth daily. 06/11/14  Yes Dale DurhamScott, Charlene, MD  losartan (COZAAR) 50 MG tablet Take 50 mg by mouth daily. 04/29/19  Yes [provider]  aspirin 81 MG tablet Take 81 mg by mouth daily.     [provider]  Calcium Carb-Cholecalciferol (CALCIUM + D3 PO) Take 600 mg by mouth 2 (two) times daily.    [provider]  cyclobenzaprine (FLEXERIL) 10 MG tablet Take 1 tablet (10 mg total) by mouth 2 (two) times daily as needed. Patient not taking: Reported on 06/26/2019 02/07/18   Joni ReiningSmith, Ronald K, PA-C  furosemide (LASIX) 20 MG tablet Take 20 mg by mouth daily as needed.    [provider]  lisinopril (PRINIVIL,ZESTRIL) 20 MG tablet Take 1 tablet (20 mg total) by mouth daily. Patient not taking: Reported on 06/26/2019 02/02/14   Dale DurhamScott, Charlene, MD  Multiple Vitamin (MULTIVITAMIN) tablet Take 1 tablet by mouth daily.    [provider]  omega-3 fish oil (MAXEPA) 1000 MG CAPS capsule Take 1 capsule by mouth 2 (two) times daily.     [provider]  pantoprazole (PROTONIX) 40 MG tablet Take 40 mg by mouth daily.    [provider]  ranitidine (ZANTAC) 150 MG capsule Take 150 mg by mouth daily.    [provider]  traMADol-acetaminophen (ULTRACET) 37.5-325 MG tablet Take 1 tablet by mouth every 8 (eight) hours as needed. Patient not taking: Reported on 06/26/2019 02/07/18   Joni ReiningSmith, Ronald K, PA-C   No results found.  Positive ROS: All other systems have been reviewed and were otherwise negative with the exception of those mentioned in the HPI and as above.  Physical Exam: General: Alert, no acute distress, patient afebrile  MUSCULOSKELETAL: Right hand/forearm: Patient's bite wound is over the dorsum of her thumb at the level of the metacarpal.  There is erythema and mild swelling of this area.  She has painless range of motion of her thumb.  She states that her motion is limited due to arthritis at baseline.  Patient's hand is well-perfused.  There is the erythema streaks of the volar radial forearm.  The borders of the erythema in the ER have been marked and the erythema appears to be receding from these borders.  Assessment: Right  hand cellulitis due to cat bite  Plan: Patient seems to be responding well to the IV Unasyn.  An ultrasound in the ER showed no abscess and clinically on exam she does not seem to have a focal collection.  I do not see any reason for surgical intervention at this time.  Continue IV antibiotics per the hospitalist service.  Consider infectious disease consult for recommendations on outpatient oral antibiotics, as patient experienced worsening symptoms despite Augmentin.  If symptoms worsen obtain MRI  of the right hand/forearm looking for an abscess.  Patient may follow-up in my office in 1 week after discharge.   Juanell FairlyKevin Cecylia Brazill, MD    06/27/2019 8:32 AM

## 2019-06-27 NOTE — Consult Note (Signed)
Pharmacy Antibiotic Note  Kristen Bartlett is a 78 y.o. female admitted on 06/26/2019 with cat bite and cellulitis.  Pharmacy has been consulted for Unasyn dosing. Patient has CKD III, Scr at baseline is normally between 1.5-1.6.  Plan: Will continue Unasyn 3g Q12H.    Height: 5\' 1"  (154.9 cm) Weight: 130 lb (59 kg) IBW/kg (Calculated) : 47.8  Temp (24hrs), Avg:98.3 F (36.8 C), Min:98.1 F (36.7 C), Max:98.6 F (37 C)  Recent Labs  Lab 06/26/19 1526 06/27/19 0438  WBC 12.1* 7.8  CREATININE 1.69* 1.54*    Estimated Creatinine Clearance: 24.5 mL/min (A) (by C-G formula based on SCr of 1.54 mg/dL (H)).    Allergies  Allergen Reactions  . Statins     Muscular  Other reaction(s): Myalgias (Muscle Pain)  . Lisinopril     Other reaction(s): Other (See Comments) cough  . Pravastatin Other (See Comments)  . Rosuvastatin Calcium     Muscular   . Meperidine Rash  . Sulfa Antibiotics Rash    Other reaction(s): Other (See Comments)    Antimicrobials this admission: 8/20 Unasyn >>   Microbiology results: N/A  Thank you for allowing pharmacy to be a part of this patient's care.  Pearla Dubonnet 06/27/2019 12:46 PM

## 2019-06-27 NOTE — Progress Notes (Signed)
Kristen Bartlett at [place] NAME: Kristen Bartlett    MR#:  [id number]  DATE OF BIRTH:  November 26, 1940  SUBJECTIVE:  CHIEF COMPLAINT:   Chief Complaint  Patient presents with  . Animal Bite   The patient feels better swelling and redness on left hand and arm. REVIEW OF SYSTEMS:  Review of Systems  Constitutional: Negative for chills, fever and malaise/fatigue.  HENT: Negative for sore throat.   Eyes: Negative for blurred vision and double vision.  Respiratory: Negative for cough, hemoptysis, shortness of breath, wheezing and stridor.   Cardiovascular: Negative for chest pain, palpitations, orthopnea and leg swelling.  Gastrointestinal: Negative for abdominal pain, blood in stool, diarrhea, melena, nausea and vomiting.  Genitourinary: Negative for dysuria, flank pain and hematuria.  Musculoskeletal: Negative for back pain and joint pain.       Right hand swelling and redness.  Neurological: Negative for dizziness, sensory change, focal weakness, seizures, loss of consciousness, weakness and headaches.  Endo/Heme/Allergies: Negative for polydipsia.  Psychiatric/Behavioral: Negative for depression. The patient is not nervous/anxious.     DRUG ALLERGIES:   Allergies  Allergen Reactions  . Statins     Muscular  Other reaction(s): Myalgias (Muscle Pain)  . Lisinopril     Other reaction(s): Other (See Comments) cough  . Pravastatin Other (See Comments)  . Rosuvastatin Calcium     Muscular   . Meperidine Rash  . Sulfa Antibiotics Rash    Other reaction(s): Other (See Comments)   VITALS:  Blood pressure 119/66, pulse 73, temperature 98.1 F (36.7 C), temperature source Oral, resp. rate 16, height 5\' 1"  (1.549 m), weight 59 kg, last menstrual period 01/18/1984, SpO2 96 %. PHYSICAL EXAMINATION:  Physical Exam Constitutional:      General: She is not in acute distress.    Appearance: Normal appearance.  HENT:     Head: Normocephalic.   Mouth/Throat:     Mouth: Mucous membranes are moist.  Eyes:     General: No scleral icterus.    Conjunctiva/sclera: Conjunctivae normal.     Pupils: Pupils are equal, round, and reactive to light.  Neck:     Musculoskeletal: Normal range of motion and neck supple.     Vascular: No JVD.     Trachea: No tracheal deviation.  Cardiovascular:     Rate and Rhythm: Normal rate and regular rhythm.     Heart sounds: Normal heart sounds. No murmur. No gallop.   Pulmonary:     Effort: Pulmonary effort is normal. No respiratory distress.     Breath sounds: Normal breath sounds. No wheezing or rales.  Abdominal:     General: Bowel sounds are normal. There is no distension.     Palpations: Abdomen is soft.     Tenderness: There is no abdominal tenderness. There is no rebound.  Musculoskeletal: Normal range of motion.        General: No tenderness.     Right lower leg: No edema.     Left lower leg: No edema.     Comments: Better sweating, erythema and tenderness on the right hand and arm.  Skin:    Findings: No erythema or rash.  Neurological:     Mental Status: She is alert and oriented to person, place, and time.     Cranial Nerves: No cranial nerve deficit.    LABORATORY PANEL:  Female CBC Recent Labs  Lab 06/27/19 0438  WBC 7.8  HGB 11.8*  HCT 35.7*  PLT 211   ------------------------------------------------------------------------------------------------------------------ Chemistries  Recent Labs  Lab 06/27/19 0438  NA 137  K 3.9  CL 107  CO2 22  GLUCOSE 87  BUN 27*  CREATININE 1.54*  CALCIUM 8.7*   RADIOLOGY:  No results found. ASSESSMENT AND PLAN:   Right hand cellulitis after cat bite- no signs of sepsis.  ED physician performed ultrasound and did not find an abscess. -Received Tdap in the ED -Continue Unasyn. Per Dr. Martha ClanKrasinski, no indication for surgical interventions this time, continue antibiotics.  Hyponatremia- may be due to some mild dehydration  Improved with normal saline IV.  Hypertension -Continue home Norvasc and losartan  Hypothyroidism- stable -Continue home Synthroid  CKD III- creatinine at baseline -Avoid nephrotoxic agents  All the records are reviewed and case discussed with Care Management/Social Worker. Management plans discussed with the patient, family and they are in agreement.  CODE STATUS: Full Code  TOTAL TIME TAKING CARE OF THIS PATIENT: 28 minutes.   More than 50% of the time was spent in counseling/coordination of care: YES  POSSIBLE D/C IN 2 DAYS, DEPENDING ON CLINICAL CONDITION.   Shaune PollackQing Prabhjot Piscitello M.D on 06/27/2019 at 2:23 PM  Between 7am to 6pm - Pager - [phone]  After 6pm go to [email or web address] - Therapist, nutritionalpassword EPAS [facility]  [facility] [place] [facility]

## 2019-06-28 NOTE — Discharge Summary (Signed)
[facility] [place] at [facility]   PATIENT NAME: Kristen Bartlett    MR#:  [id number]  DATE OF BIRTH:  July 29, 1941  DATE OF ADMISSION:  06/26/2019   ADMITTING PHYSICIAN: Campbell StallKaty Dodd Mayo, MD  DATE OF DISCHARGE: 06/28/2019  9:40 AM  PRIMARY CARE PHYSICIAN: Rosemarie Axobertson, Noelle, MD   ADMISSION DIAGNOSIS:  Cellulitis of right upper extremity [L03.113] Cat bite, initial encounter [W55.01XA] DISCHARGE DIAGNOSIS:  Active Problems:   Cellulitis of right hand  SECONDARY DIAGNOSIS:   Past Medical History:  Diagnosis Date  . Arthritis   . GERD (gastroesophageal reflux disease)   . Heart murmur   . Hypercholesterolemia   . Hypertension   . Hypothyroidism   . Syncope and collapse   . Thin basement membrane disease   . Thin basement membrane disease    HOSPITAL COURSE:  Right hand cellulitis after cat bite-no signs of sepsis. ED physician performed ultrasound and did not find an abscess. -Received Tdap in the ED She is treated with Unasyn. Cellulitis is much better. Change to augmentin after discharge. Per Dr. Martha ClanKrasinski, no indication for surgical interventions this time, continue antibiotics.  Follow-up Dr. Martha ClanKrasinski as outpatient.  Hyponatremia- may be due to some mild dehydration Improved with normal saline IV.  Hypertension -Continue home Norvasc and losartan  Hypothyroidism-stable -Continue home Synthroid  CKD III-creatinine at baseline -Avoid nephrotoxic agents  DISCHARGE CONDITIONS:  Stable, discharged to home today. CONSULTS OBTAINED:   DRUG ALLERGIES:   Allergies  Allergen Reactions  . Statins     Muscular  Other reaction(s): Myalgias (Muscle Pain)  . Lisinopril     Other reaction(s): Other (See Comments) cough  . Pravastatin Other (See Comments)  . Rosuvastatin Calcium     Muscular   . Meperidine Rash  . Sulfa Antibiotics Rash    Other reaction(s): Other (See Comments)   DISCHARGE MEDICATIONS:   Allergies as of 06/28/2019       Reactions   Statins    Muscular  Other reaction(s): Myalgias (Muscle Pain)   Lisinopril    Other reaction(s): Other (See Comments) cough   Pravastatin Other (See Comments)   Rosuvastatin Calcium    Muscular    Meperidine Rash   Sulfa Antibiotics Rash   Other reaction(s): Other (See Comments)      Medication List    STOP taking these medications   cyclobenzaprine 10 MG tablet Commonly known as: FLEXERIL   lisinopril 20 MG tablet Commonly known as: ZESTRIL   traMADol-acetaminophen 37.5-325 MG tablet Commonly known as: Ultracet     TAKE these medications   acetaminophen 500 MG tablet Commonly known as: TYLENOL Take 1,500 mg by mouth every 6 (six) hours as needed for moderate pain.   acyclovir 200 MG capsule Commonly known as: ZOVIRAX Take 1 capsule (200 mg total) by mouth daily.   amLODipine 10 MG tablet Commonly known as: NORVASC Take 10 mg by mouth daily.   amoxicillin-clavulanate 875-125 MG tablet Commonly known as: AUGMENTIN Take 1 tablet by mouth 2 (two) times daily. For 7 days   aspirin 81 MG tablet Take 81 mg by mouth daily.   CALCIUM + D3 PO Take 600 mg by mouth 2 (two) times daily.   famotidine 20 MG tablet Commonly known as: PEPCID Take 20 mg by mouth 2 (two) times daily.   furosemide 20 MG tablet Commonly known as: LASIX Take 20 mg by mouth daily as needed.   levothyroxine 50 MCG tablet Commonly known as: SYNTHROID Take 1 tablet (  50 mcg total) by mouth daily.   losartan 50 MG tablet Commonly known as: COZAAR Take 50 mg by mouth daily.   multivitamin tablet Take 1 tablet by mouth daily.   pantoprazole 40 MG tablet Commonly known as: PROTONIX Take 40 mg by mouth daily.   ranitidine 150 MG capsule Commonly known as: ZANTAC Take 150 mg by mouth daily.        DISCHARGE INSTRUCTIONS:  See AVS.  If you experience worsening of your admission symptoms, develop shortness of breath, life threatening emergency, suicidal or homicidal  thoughts you must seek medical attention immediately by calling 911 or calling your MD immediately  if symptoms less severe.  You Must read complete instructions/literature along with all the possible adverse reactions/side effects for all the Medicines you take and that have been prescribed to you. Take any new Medicines after you have completely understood and accpet all the possible adverse reactions/side effects.   Please note  You were cared for by a hospitalist during your hospital stay. If you have any questions about your discharge medications or the care you received while you were in the hospital after you are discharged, you can call the unit and asked to speak with the hospitalist on call if the hospitalist that took care of you is not available. Once you are discharged, your primary care physician will handle any further medical issues. Please note that NO REFILLS for any discharge medications will be authorized once you are discharged, as it is imperative that you return to your primary care physician (or establish a relationship with a primary care physician if you do not have one) for your aftercare needs so that they can reassess your need for medications and monitor your lab values.    On the day of Discharge:  VITAL SIGNS:  Blood pressure 129/64, pulse 66, temperature 98.4 F (36.9 C), temperature source Oral, resp. rate 19, height 5\' 1"  (1.549 m), weight 59 kg, last menstrual period 01/18/1984, SpO2 99 %. PHYSICAL EXAMINATION:  GENERAL:  78 y.o.-year-old patient lying in the bed with no acute distress.  EYES: Pupils equal, round, reactive to light and accommodation. No scleral icterus. Extraocular muscles intact.  HEENT: Head atraumatic, normocephalic. Oropharynx and nasopharynx clear.  NECK:  Supple, no jugular venous distention. No thyroid enlargement, no tenderness.  LUNGS: Normal breath sounds bilaterally, no wheezing, rales,rhonchi or crepitation. No use of accessory  muscles of respiration.  CARDIOVASCULAR: S1, S2 normal. No murmurs, rubs, or gallops.  ABDOMEN: Soft, non-tender, non-distended. Bowel sounds present. No organomegaly or mass.  EXTREMITIES: No pedal edema, cyanosis, or clubbing.  Left hand mild erythema and tenderness. NEUROLOGIC: Cranial nerves II through XII are intact. Muscle strength 5/5 in all extremities. Sensation intact. Gait not checked.  PSYCHIATRIC: The patient is alert and oriented x 3.  SKIN: No obvious rash, lesion, or ulcer.  DATA REVIEW:   CBC Recent Labs  Lab 06/27/19 0438  WBC 7.8  HGB 11.8*  HCT 35.7*  PLT 211    Chemistries  Recent Labs  Lab 06/27/19 0438  NA 137  K 3.9  CL 107  CO2 22  GLUCOSE 87  BUN 27*  CREATININE 1.54*  CALCIUM 8.7*     Microbiology Results  Results for orders placed or performed during the hospital encounter of 06/26/19  SARS Coronavirus 2 [facility] order, Performed in [facility] hospital lab) Nasopharyngeal Nasopharyngeal Swab     Status: None   Collection Time: 06/26/19  7:39 PM   Specimen:  Nasopharyngeal Swab  Result Value Ref Range Status   SARS Coronavirus 2 NEGATIVE NEGATIVE Final    Comment: (NOTE) If result is NEGATIVE SARS-CoV-2 target nucleic acids are NOT DETECTED. The SARS-CoV-2 RNA is generally detectable in upper and lower  respiratory specimens during the acute phase of infection. The lowest  concentration of SARS-CoV-2 viral copies this assay can detect is 250  copies / mL. A negative result does not preclude SARS-CoV-2 infection  and should not be used as the sole basis for treatment or other  patient management decisions.  A negative result may occur with  improper specimen collection / handling, submission of specimen other  than nasopharyngeal swab, presence of viral mutation(s) within the  areas targeted by this assay, and inadequate number of viral copies  (<250 copies / mL). A negative result must be combined with clinical  observations, patient  history, and epidemiological information. If result is POSITIVE SARS-CoV-2 target nucleic acids are DETECTED. The SARS-CoV-2 RNA is generally detectable in upper and lower  respiratory specimens dur ing the acute phase of infection.  Positive  results are indicative of active infection with SARS-CoV-2.  Clinical  correlation with patient history and other diagnostic information is  necessary to determine patient infection status.  Positive results do  not rule out bacterial infection or co-infection with other viruses. If result is PRESUMPTIVE POSTIVE SARS-CoV-2 nucleic acids MAY BE PRESENT.   A presumptive positive result was obtained on the submitted specimen  and confirmed on repeat testing.  While 2019 novel coronavirus  (SARS-CoV-2) nucleic acids may be present in the submitted sample  additional confirmatory testing may be necessary for epidemiological  and / or clinical management purposes  to differentiate between  SARS-CoV-2 and other Sarbecovirus currently known to infect humans.  If clinically indicated additional testing with an alternate test  methodology [phone] is advised. The SARS-CoV-2 RNA is generally  detectable in upper and lower respiratory sp ecimens during the acute  phase of infection. The expected result is Negative. Fact Sheet for Patients:  BoilerBrush.com.cyhttps://www.fda.gov/media/136312/download Fact Sheet for Healthcare Providers: https://pope.com/https://www.fda.gov/media/136313/download This test is not yet approved or cleared by the [place] FDA and has been authorized for detection and/or diagnosis of SARS-CoV-2 by FDA under an Emergency Use Authorization (EUA).  This EUA will remain in effect (meaning this test can be used) for the duration of the COVID-19 declaration under Section 564(b)(1) of the Act, 21 U.S.C. section 360bbb-3(b)(1), unless the authorization is terminated or revoked sooner. Performed at [facility], [address] [place], [place] [address]      RADIOLOGY:  No results found.   Management plans discussed with the patient, family and they are in agreement.  CODE STATUS: Full Code   TOTAL TIME TAKING CARE OF THIS PATIENT: 32 minutes.    Shaune PollackQing Keyon Winnick M.D on 06/28/2019 at 9:04 AM  Between 7am to 6pm - Pager - [phone]  After 6pm go to [email or web address] - Social research officer, governmentpassword EPAS [facility]  [facility] [place] [facility]  Office  [phone]  CC: Primary care physician; Rosemarie Axobertson, Noelle, MD   Note: This dictation was prepared with Dragon dictation along with smaller phrase technology. Any transcriptional errors that result from this process are unintentional.

## 2019-06-28 NOTE — Progress Notes (Signed)
Patient discharging home, instructions given to patient, verbalized understanding. IV removed. Patient will drive herself home.

## 2020-01-05 ENCOUNTER — Emergency Department
Admission: EM | Admit: 2020-01-05 | Discharge: 2020-01-05 | Disposition: A | Discharge status: Home / Self Care | Payer: Medicare Other | Attending: Student | Admitting: Student

## 2020-01-05 ENCOUNTER — Encounter: Payer: Self-pay | Admitting: Intensive Care

## 2020-01-05 ENCOUNTER — Other Ambulatory Visit: Payer: Self-pay

## 2020-01-05 ENCOUNTER — Emergency Department: Payer: Medicare Other

## 2020-01-05 DIAGNOSIS — K921 Melena: Secondary | ICD-10-CM | POA: Insufficient documentation

## 2020-01-05 DIAGNOSIS — Z79899 Other long term (current) drug therapy: Secondary | ICD-10-CM | POA: Insufficient documentation

## 2020-01-05 DIAGNOSIS — R1084 Generalized abdominal pain: Secondary | ICD-10-CM | POA: Insufficient documentation

## 2020-01-05 DIAGNOSIS — I10 Essential (primary) hypertension: Secondary | ICD-10-CM | POA: Diagnosis not present

## 2020-01-05 DIAGNOSIS — R197 Diarrhea, unspecified: Secondary | ICD-10-CM | POA: Diagnosis present

## 2020-01-05 DIAGNOSIS — K529 Noninfective gastroenteritis and colitis, unspecified: Secondary | ICD-10-CM | POA: Diagnosis not present

## 2020-01-05 LAB — CBC WITH DIFFERENTIAL/PLATELET
Abs Immature Granulocytes: 0.05 10*3/uL (ref 0.00–0.07)
Basophils Absolute: 0.1 10*3/uL (ref 0.0–0.1)
Basophils Relative: 0 %
Eosinophils Absolute: 0 10*3/uL (ref 0.0–0.5)
Eosinophils Relative: 0 %
HCT: 41.1 % (ref 36.0–46.0)
Hemoglobin: 13.4 g/dL (ref 12.0–15.0)
Immature Granulocytes: 0 %
Lymphocytes Relative: 9 %
Lymphs Abs: 1.5 10*3/uL (ref 0.7–4.0)
MCH: 32 pg (ref 26.0–34.0)
MCHC: 32.6 g/dL (ref 30.0–36.0)
MCV: 98.1 fL (ref 80.0–100.0)
Monocytes Absolute: 0.9 10*3/uL (ref 0.1–1.0)
Monocytes Relative: 5 %
Neutro Abs: 14 10*3/uL — ABNORMAL HIGH (ref 1.7–7.7)
Neutrophils Relative %: 86 %
Platelets: 254 10*3/uL (ref 150–400)
RBC: 4.19 MIL/uL (ref 3.87–5.11)
RDW: 13.1 % (ref 11.5–15.5)
WBC: 16.5 10*3/uL — ABNORMAL HIGH (ref 4.0–10.5)
nRBC: 0 % (ref 0.0–0.2)

## 2020-01-05 LAB — COMPREHENSIVE METABOLIC PANEL
ALT: 19 U/L (ref 0–44)
AST: 33 U/L (ref 15–41)
Albumin: 4.5 g/dL (ref 3.5–5.0)
Alkaline Phosphatase: 99 U/L (ref 38–126)
Anion gap: 13 (ref 5–15)
BUN: 39 mg/dL — ABNORMAL HIGH (ref 8–23)
CO2: 22 mmol/L (ref 22–32)
Calcium: 9.5 mg/dL (ref 8.9–10.3)
Chloride: 105 mmol/L (ref 98–111)
Creatinine, Ser: 1.76 mg/dL — ABNORMAL HIGH (ref 0.44–1.00)
GFR calc Af Amer: 31 mL/min — ABNORMAL LOW (ref >60)
GFR calc non Af Amer: 27 mL/min — ABNORMAL LOW (ref >60)
Glucose, Bld: 131 mg/dL — ABNORMAL HIGH (ref 70–99)
Potassium: 3.9 mmol/L (ref 3.5–5.1)
Sodium: 140 mmol/L (ref 135–145)
Total Bilirubin: 1.3 mg/dL — ABNORMAL HIGH (ref 0.3–1.2)
Total Protein: 8.2 g/dL — ABNORMAL HIGH (ref 6.5–8.1)

## 2020-01-05 LAB — TYPE AND SCREEN
ABO/RH(D): AB POS
Antibody Screen: NEGATIVE

## 2020-01-05 LAB — LIPASE, BLOOD: Lipase: 46 U/L (ref 11–51)

## 2020-01-05 MED ORDER — METRONIDAZOLE 500 MG PO TABS: 500.0000 mg | ORAL_TABLET | Freq: Three times a day (TID) | ORAL | 0 refills | Status: AC

## 2020-01-05 MED ORDER — IOHEXOL 9 MG/ML PO SOLN
500.0000 mL | ORAL | Status: AC
  Administered 2020-01-05: 10:00:00 500 mL via ORAL

## 2020-01-05 MED ORDER — METRONIDAZOLE IN NACL 5-0.79 MG/ML-% IV SOLN
500.0000 mg | Freq: Once | INTRAVENOUS | Status: AC
  Administered 2020-01-05: 500 mg via INTRAVENOUS
  Filled 2020-01-05: qty 100

## 2020-01-05 MED ORDER — CIPROFLOXACIN IN D5W 400 MG/200ML IV SOLN
400.0000 mg | Freq: Once | INTRAVENOUS | Status: AC
  Administered 2020-01-05: 400 mg via INTRAVENOUS
  Filled 2020-01-05: qty 200

## 2020-01-05 MED ORDER — CIPROFLOXACIN HCL 500 MG PO TABS: 500.0000 mg | ORAL_TABLET | Freq: Two times a day (BID) | ORAL | 0 refills | Status: AC

## 2020-01-05 NOTE — ED Triage Notes (Signed)
Patient c/o diarrhea and GI bleed starting around 9pm throughout night. C/o discomfort in abdomen

## 2020-01-05 NOTE — Discharge Instructions (Addendum)
Thank you for letting us take care of you in the emergency department today.   Please continue to take any regular, prescribed medications.   New medications we have prescribed:  - 2 antibiotics - Ciprofloxacin and Flagyl, take as directed  Please follow up with: - Your primary care doctor to review your ER visit and follow up on your symptoms.  - A GI doctor (information for one is below)  Please return to the ER for any new or worsening symptoms.

## 2020-01-05 NOTE — ED Provider Notes (Signed)
[facility] Emergency Department Provider Note  ____________________________________________   First MD Initiated Contact with Patient 01/05/20 [phone]     (approximate)  I have reviewed the triage vital signs and the nursing notes.  History  Chief Complaint Abdominal Pain and GI Bleeding    HPI Kristen Bartlett is a 79 y.o. female with history of thin basement membrane disease, hypothyroidism, HTN, HLD, GERD who presents to the emergency department for abdominal discomfort, bloody diarrhea.  Patient states symptoms started last night around 9 PM.  She has had multiple episodes of diarrhea, which have progressed to being bloody.  She reports seeing bright red blood in the toilet bowl mixed with stool.  Denies any clots.  Reports an associated abdominal pain, which she describes as a discomfort.  At the onset of her symptoms last night this discomfort was severe, but now has reduced and currently 1/10 in severity.  Generalized.  No radiation, or alleviating/aggravating components.  Had some associated nausea, but no vomiting.  No fevers.  Denies any recent antibiotics.  Not on any blood thinning medications.  Never had a colonoscopy.   Past Medical Hx Past Medical History:  Diagnosis Date  . Arthritis   . GERD (gastroesophageal reflux disease)   . Heart murmur   . Hypercholesterolemia   . Hypertension   . Hypothyroidism   . Syncope and collapse   . Thin basement membrane disease   . Thin basement membrane disease   . Thin basement membrane disease     Problem List Patient Active Problem List   Diagnosis Date Noted  . Cellulitis of right hand 06/26/2019  . Cough 08/02/2014  . Sty 08/02/2014  . Hip pain, acute 10/05/2013  . Carpal tunnel syndrome 07/26/2013  . Syncope 03/27/2013  . Essential hypertension, benign 01/19/2013  . GERD (gastroesophageal reflux disease) 01/19/2013  . Hypercholesterolemia 01/19/2013  . Thin basement membrane disease 01/19/2013   . Hypothyroidism 01/19/2013    Past Surgical Hx Past Surgical History:  Procedure Laterality Date  . TENDON RELEASE     right arm   . TONSILLECTOMY  1955    Medications Prior to Admission medications   Medication Sig Start Date End Date Taking? Authorizing Provider  acetaminophen (TYLENOL) 500 MG tablet Take 1,500 mg by mouth every 6 (six) hours as needed for moderate pain.    [provider]  acyclovir (ZOVIRAX) 200 MG capsule Take 1 capsule (200 mg total) by mouth daily. 07/28/14   Dale Roberts, MD  amLODipine (NORVASC) 10 MG tablet Take 10 mg by mouth daily. 06/09/19 06/08/20  [provider]  aspirin 81 MG tablet Take 81 mg by mouth daily.    [provider]  Calcium Carb-Cholecalciferol (CALCIUM + D3 PO) Take 600 mg by mouth 2 (two) times daily.    [provider]  famotidine (PEPCID) 20 MG tablet Take 20 mg by mouth 2 (two) times daily. 03/20/19   [provider]  furosemide (LASIX) 20 MG tablet Take 20 mg by mouth daily as needed.    [provider]  levothyroxine (SYNTHROID, LEVOTHROID) 50 MCG tablet Take 1 tablet (50 mcg total) by mouth daily. 06/11/14   Dale Atka, MD  losartan (COZAAR) 50 MG tablet Take 50 mg by mouth daily. 04/29/19   [provider]  Multiple Vitamin (MULTIVITAMIN) tablet Take 1 tablet by mouth daily.    [provider]  pantoprazole (PROTONIX) 40 MG tablet Take 40 mg by mouth daily.    [provider]  ranitidine (ZANTAC) 150 MG capsule Take 150 mg by mouth daily.    [provider]    Allergies Statins, Lisinopril, Pravastatin, Rosuvastatin calcium, Meperidine, and Sulfa antibiotics  Family Hx Family History  Problem Relation Age of Onset  . Arthritis Mother   . Diabetes Father   . Heart disease Father     Social Hx Social History   Tobacco Use  . Smoking status: Former Smoker    Packs/day: 2.00    Years: 30.00    Pack years: 60.00    Types:  Cigarettes    Quit date: 12/09/1983    Years since quitting: 36.0  . Smokeless tobacco: Never Used  Substance Use Topics  . Alcohol use: Yes    Comment: occasional glass of wine  . Drug use: No     Review of Systems  Constitutional: Negative for fever, chills. Eyes: Negative for visual changes. ENT: Negative for sore throat. Cardiovascular: Negative for chest pain. Respiratory: Negative for shortness of breath. Gastrointestinal: Positive for bloody diarrhea, abdominal pain. Genitourinary: Negative for dysuria. Musculoskeletal: Negative for leg swelling. Skin: Negative for rash. Neurological: Negative for headaches.   Physical Exam  Vital Signs: ED Triage Vitals  Enc Vitals Group     BP 01/05/20 0934 (!) 147/67     Pulse Rate 01/05/20 0934 79     Resp 01/05/20 0934 14     Temp 01/05/20 0934 98.3 F (36.8 C)     Temp Source 01/05/20 0934 Oral     SpO2 01/05/20 0934 100 %     Weight 01/05/20 0935 129 lb (58.5 kg)     Height 01/05/20 0935 5\' 1"  (1.549 m)     Head Circumference --      Peak Flow --      Pain Score 01/05/20 0935 1     Pain Loc --      Pain Edu? --      Excl. in [place]? --      Constitutional: Alert and oriented.  Head: Normocephalic. Atraumatic. Eyes: Conjunctivae clear. Sclera anicteric. Nose: No congestion. No rhinorrhea. Mouth/Throat: Wearing mask.  Neck: No stridor.   Cardiovascular: Normal rate, regular rhythm. Extremities well perfused. Respiratory: Normal respiratory effort.  Lungs CTAB. Gastrointestinal: Soft. Mildly tender throughout, L slightly greater than R. No rebound or guarding. Non-distended.  Rectal: RN chaperone present.  No external or internal hemorrhoids appreciated.  Brown stool mixed with bright red blood, guaiac positive. Musculoskeletal: No lower extremity edema. No deformities. Neurologic:  Normal speech and language. No gross focal neurologic deficits are appreciated.  Skin: Skin is warm, dry and intact. No rash  noted. Psychiatric: Mood and affect are appropriate for situation.  EKG  Personally reviewed.   Rate: 83 Rhythm: sinus Axis: normal Intervals: WNL No acute ischemic changes No STEMI    Radiology  CT: IMPRESSION:  1. Long segment of submucosal edema involving the descending colon consistent with segmental colitis. Differential include ischemic colitis, inflammatory bowel disease, drug related colitis, and infectious colitis.  2. Atherosclerotic calcification of the aorta.    Procedures  Procedure(s) performed (including critical care):  Procedures   Initial Impression / Assessment and Plan / ED Course  79 y.o. female who presents to the ED for abdominal discomfort, bloody diarrhea  Ddx: colitis, diverticulitis, diverticular bleeding  Will evaluate with labs, imaging  Work up reveals leukocytosis to 16.5, Cr very mildly increased from prior, likely in setting of dehydration from volume loss 2/2 diarrhea. CT reveals colitis, suspect  infectious in etiology based on presentation. No risk factors for ischemic colitis, confirmed with nephrology that thin basement membrane disease does not result in pro-coagulable state. Received first dose antibiotics here and will plan for discharge with PO course. Advised PCP and GI follow up. Patient agreeable with plan, all questions answer. Given return precautions.    Final Clinical Impression(s) / ED Diagnosis  Final diagnoses:  Bloody diarrhea  Colitis       Note:  This document was prepared using Dragon voice recognition software and may include unintentional dictation errors.   Miguel Aschoff., MD 01/05/20 [phone]

## 2020-10-16 IMAGING — CT CT ABD-PELV W/O CM
2 of 4 series · 17 of 46 positions shown, 19 images · non-contrast
Comparison: None.

CLINICAL DATA: Diarrhea. Abdominal pain.

EXAM:
CT ABDOMEN AND PELVIS WITHOUT CONTRAST
TECHNIQUE: Multidetector CT imaging of the abdomen and pelvis was performed
following the standard protocol without IV contrast.

[Series 2: routine abd/pel wo · axial · 0.76mm/px · z∈[-413,-58]mm · 14 of 79 slices shown, 16 images]
[im 4/79  soft-tissue]
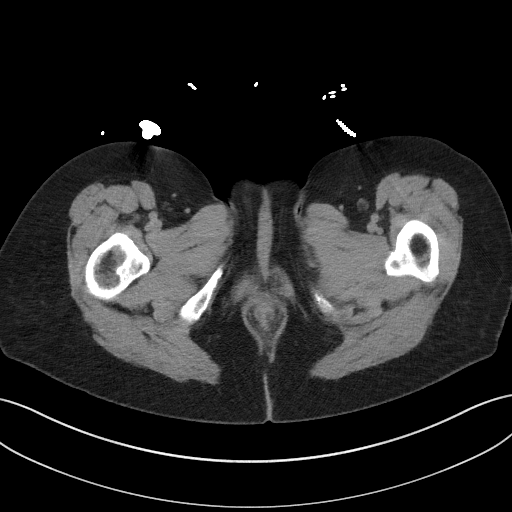
[im 4/79  bone]
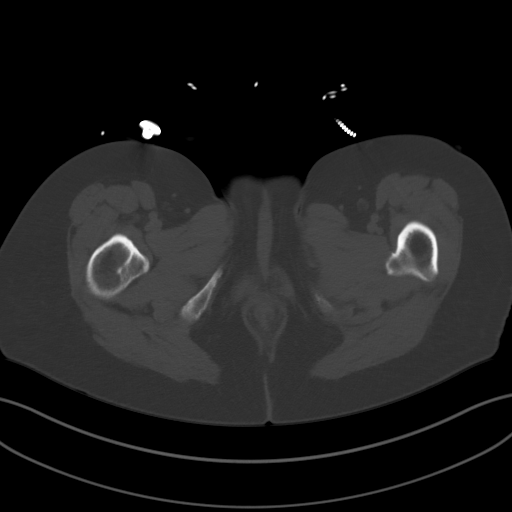
[im 11/79  soft-tissue]
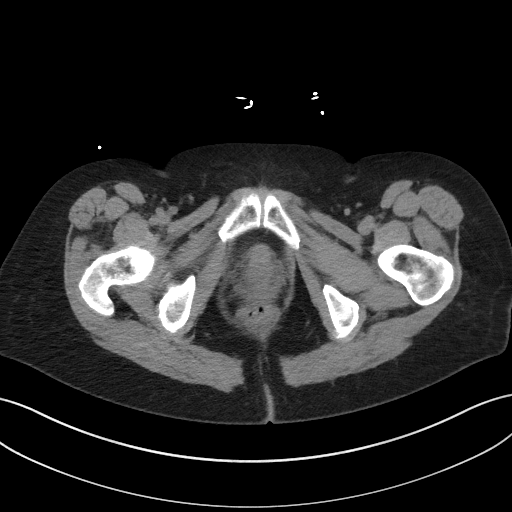
[im 14/79  soft-tissue]
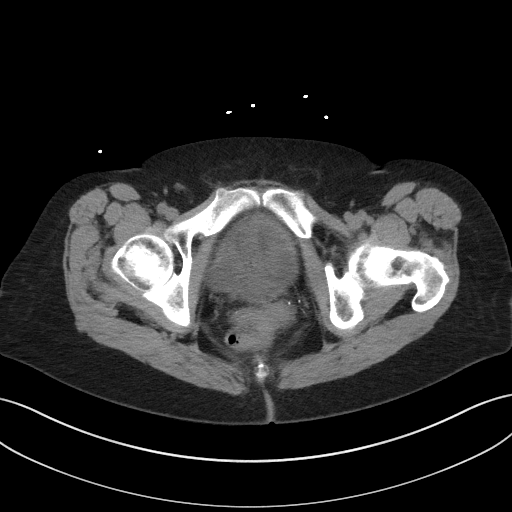
[im 21/79  soft-tissue]
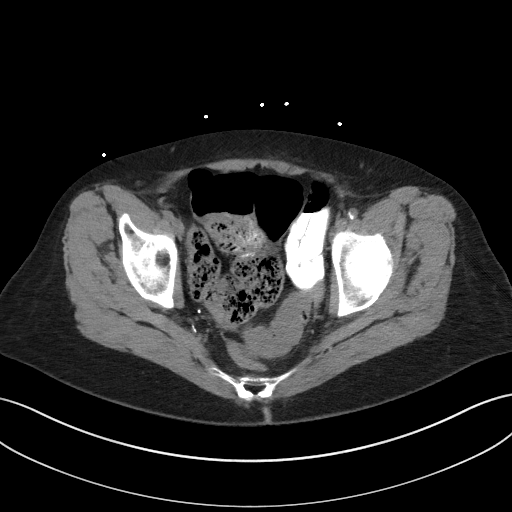
[im 28/79  soft-tissue]
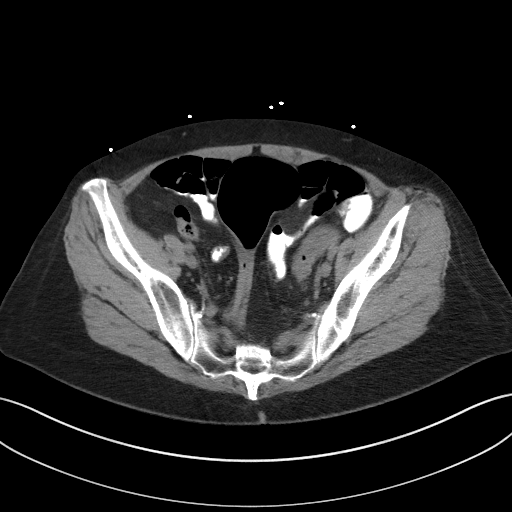
[im 31/79  soft-tissue]
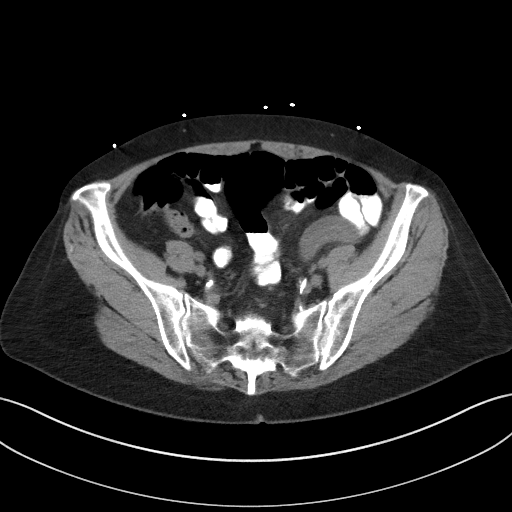
[im 38/79  soft-tissue]
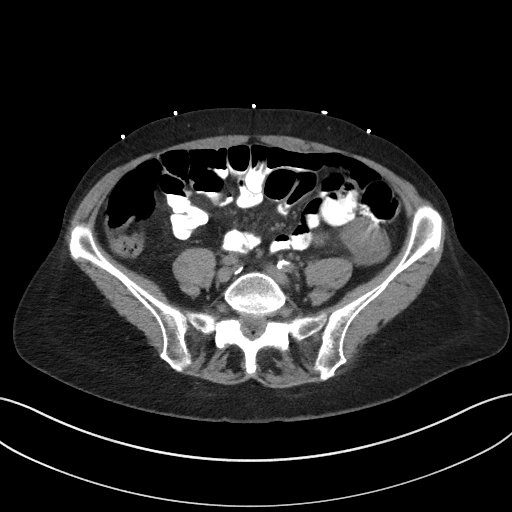
[im 41/79  soft-tissue]
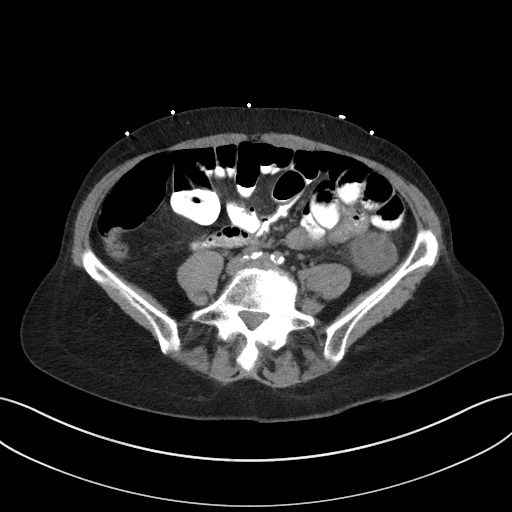
[im 48/79  soft-tissue]
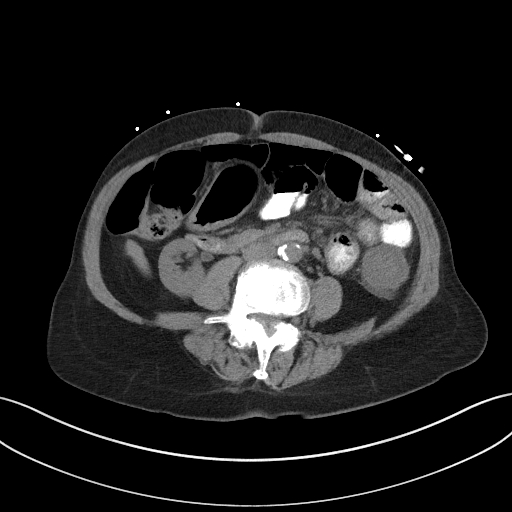
[im 48/79  bone]
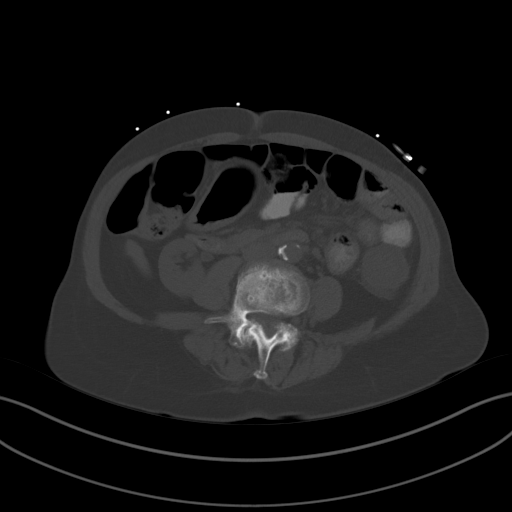
[im 51/79  soft-tissue]
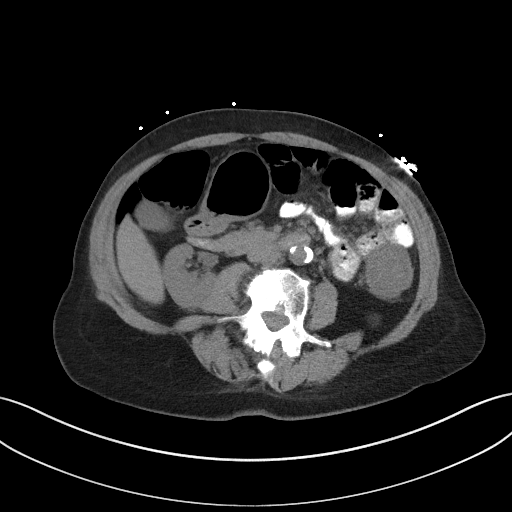
[im 58/79  soft-tissue]
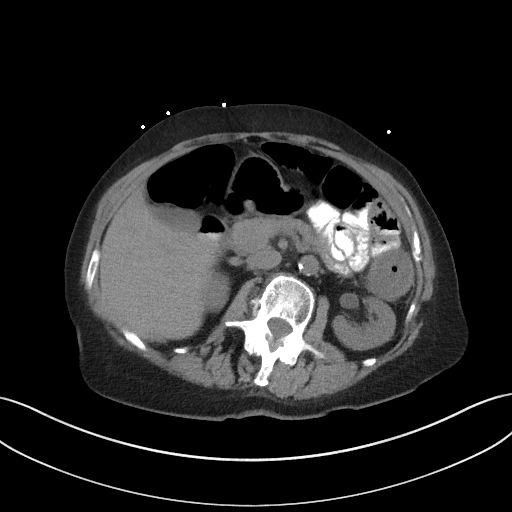
[im 65/79  soft-tissue]
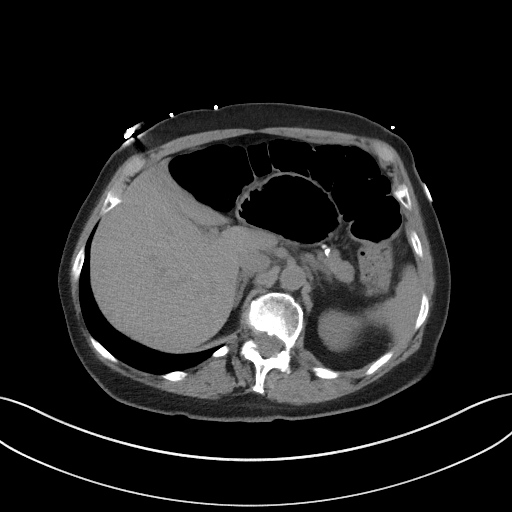
[im 68/79  soft-tissue]
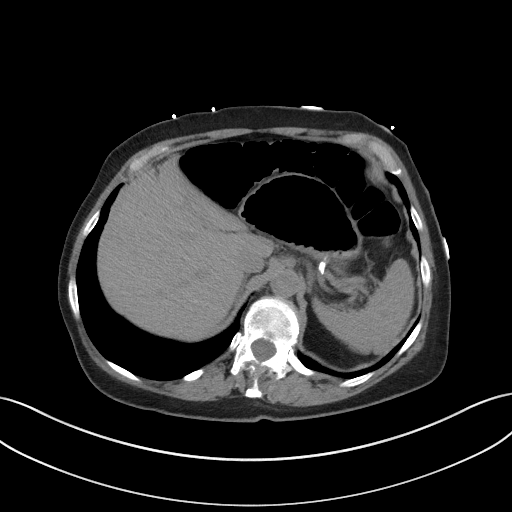
[im 75/79  soft-tissue]
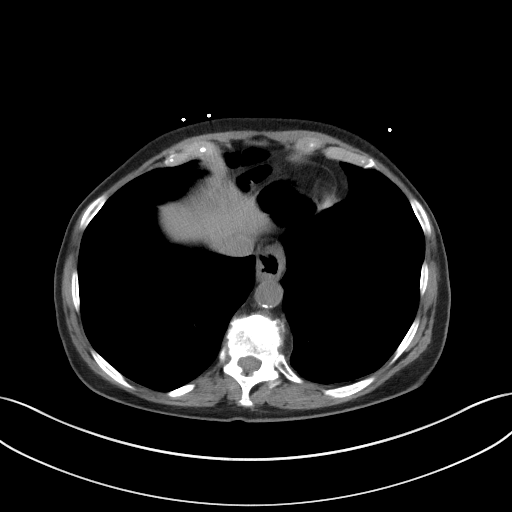

[Series 5: coronal st · coronal · 0.73mm/px · 3 of 81 slices shown]
[im 27/81  soft-tissue]
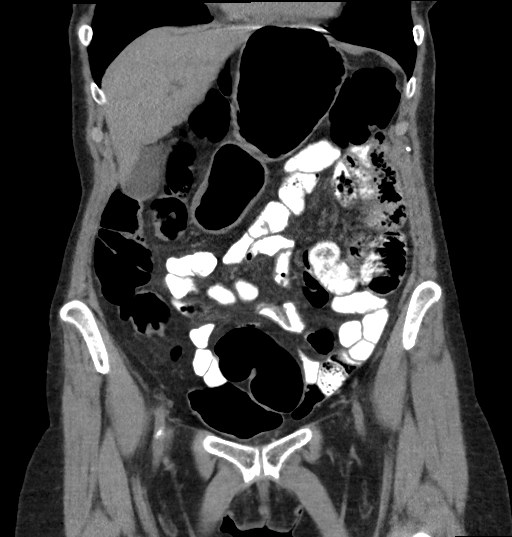
[im 36/81  soft-tissue]
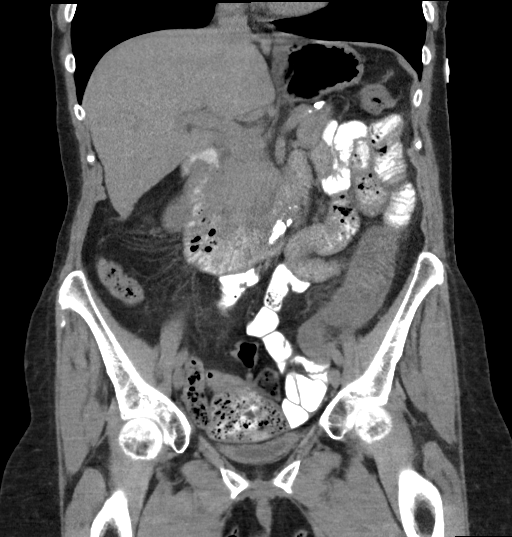
[im 45/81  soft-tissue]
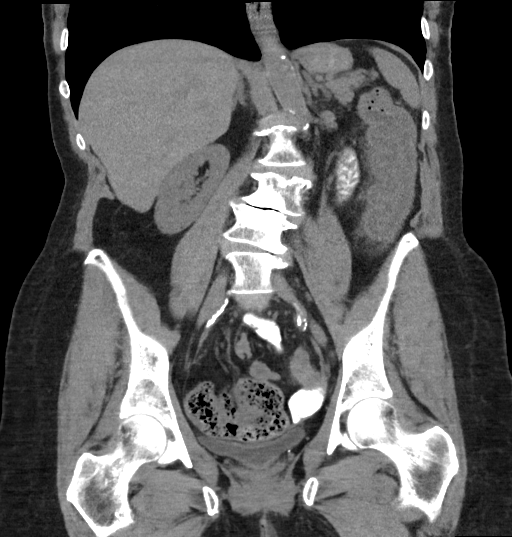

[17 of 46 positions shown; findings below may reference images not displayed]

FINDINGS: Lower chest: Lung bases are clear.

Hepatobiliary: No focal hepatic lesion. No biliary duct dilatation.
Gallbladder is normal. Common bile duct is normal.

Pancreas: Pancreas is normal. No ductal dilatation. No pancreatic
inflammation.

Spleen: Spleen is normal. Several small splenules lateral to the
spleen.

Adrenals/urinary tract: Adrenal glands and kidneys are normal. The
ureters and bladder normal.

Stomach/Bowel: Stomach, duodenum small-bowel normal. Cecum is within
the anterior pelvis. Appendix normal. Ascending and transverse colon
are normal.

There is long segment of submucosal edema involving the near
entirety of the descending colon. Bowel wall thickening up to 12 mm
(image [DATE]). The thickened segment measures approximately 16 cm in
length (image 45/5. No diverticular disease through this region of
thickened bowel. Only mild a pericolonic inflammation. The sigmoid
colon rectum are normal.

Vascular/Lymphatic: Intimal calcification of the abdominal aorta. No
aneurysm. Iliac arteries normal.

No pelvic lymphadenopathy.

Reproductive: Post hysterectomy

Other: No free fluid.

Musculoskeletal: No aggressive osseous lesion.
IMPRESSION: 1. Long segment of submucosal edema involving the descending colon
consistent with segmental colitis. Differential include ischemic
colitis, inflammatory bowel disease, drug related colitis, and
infectious colitis.
2. Atherosclerotic calcification of the aorta.

## 2022-12-14 ENCOUNTER — Encounter: Payer: Self-pay | Admitting: *Deleted

## 2022-12-14 ENCOUNTER — Encounter: Payer: HMO | Attending: Internal Medicine | Admitting: *Deleted

## 2022-12-14 DIAGNOSIS — J479 Bronchiectasis, uncomplicated: Secondary | ICD-10-CM | POA: Insufficient documentation

## 2022-12-14 DIAGNOSIS — J439 Emphysema, unspecified: Secondary | ICD-10-CM | POA: Insufficient documentation

## 2022-12-14 DIAGNOSIS — Z87891 Personal history of nicotine dependence: Secondary | ICD-10-CM | POA: Insufficient documentation

## 2022-12-14 NOTE — Progress Notes (Signed)
Virtual orientation call completed today. shehas an appointment on Date: 12/20/2022  for EP eval and gym Orientation.  Documentation of diagnosis can be found in [facility] Date: [id number] .

## 2022-12-20 VITALS — Ht 61.5 in | Wt 129.8 lb

## 2022-12-20 DIAGNOSIS — J439 Emphysema, unspecified: Secondary | ICD-10-CM | POA: Diagnosis present

## 2022-12-20 DIAGNOSIS — J479 Bronchiectasis, uncomplicated: Secondary | ICD-10-CM | POA: Diagnosis not present

## 2022-12-20 DIAGNOSIS — Z87891 Personal history of nicotine dependence: Secondary | ICD-10-CM | POA: Diagnosis not present

## 2022-12-20 NOTE — Patient Instructions (Signed)
Patient Instructions  Patient Details  Name: Kristen Bartlett MRN: [id number] Date of Birth: 11/17/1940 Referring Provider:  Kirke Shaggy, MD  Below are your personal goals for exercise, nutrition, and risk factors. Our goal is to help you stay on track towards obtaining and maintaining these goals. We will be discussing your progress on these goals with you throughout the program.  Initial Exercise Prescription:  Initial Exercise Prescription - 12/20/22 1700       Date of Initial Exercise RX and Referring Provider   Date 12/20/22    Referring Provider Mina Marble, Tai MD      Oxygen   Maintain Oxygen Saturation 88% or higher      Treadmill   MPH 1.6    Grade 0    Minutes 15    METs 2.23      NuStep   Level 1    SPM 80    Minutes 15    METs 1.8      Biostep-RELP   Level 1    SPM 50    Minutes 15    METs 1.8      Track   Laps 20    Minutes 15    METs 2.09      Prescription Details   Frequency (times per week) 3    Duration Progress to 30 minutes of continuous aerobic without signs/symptoms of physical distress      Intensity   THRR 40-80% of Max Heartrate 97 - 123    Ratings of Perceived Exertion 11-13    Perceived Dyspnea 0-4      Progression   Progression Continue to progress workloads to maintain intensity without signs/symptoms of physical distress.      Resistance Training   Training Prescription Yes    Weight 2 lb    Reps 10-15             Exercise Goals: Frequency: Be able to perform aerobic exercise two to three times per week in program working toward 2-5 days per week of home exercise.  Intensity: Work with a perceived exertion of 11 (fairly light) - 15 (hard) while following your exercise prescription.  We will make changes to your prescription with you as you progress through the program.   Duration: Be able to do 30 to 45 minutes of continuous aerobic exercise in addition to a 5 minute warm-up and a 5 minute cool-down routine.   Nutrition  Goals: Your personal nutrition goals will be established when you do your nutrition analysis with the dietician.  The following are general nutrition guidelines to follow: Cholesterol < 253m/day Sodium < 15048mday Fiber: Women over 50 yrs - 21 grams per day  Personal Goals:  Personal Goals and Risk Factors at Admission - 12/20/22 1716       Core Components/Risk Factors/Patient Goals on Admission    Weight Management Yes;Weight Maintenance    Intervention Weight Management: Develop a combined nutrition and exercise program designed to reach desired caloric intake, while maintaining appropriate intake of nutrient and fiber, sodium and fats, and appropriate energy expenditure required for the weight goal.;Weight Management: Provide education and appropriate resources to help participant work on and attain dietary goals.;Weight Management/Obesity: Establish reasonable short term and long term weight goals.    Admit Weight 129 lb (58.5 kg)    Goal Weight: Short Term 129 lb (58.5 kg)    Goal Weight: Long Term 129 lb (58.5 kg)    Expected Outcomes Short Term: Continue to assess and modify interventions  until short term weight is achieved;Long Term: Adherence to nutrition and physical activity/exercise program aimed toward attainment of established weight goal;Weight Maintenance: Understanding of the daily nutrition guidelines, which includes 25-35% calories from fat, 7% or less cal from saturated fats, less than 211m cholesterol, less than 1.5gm of sodium, & 5 or more servings of fruits and vegetables daily;Understanding recommendations for meals to include 15-35% energy as protein, 25-35% energy from fat, 35-60% energy from carbohydrates, less than 2096mof dietary cholesterol, 20-35 gm of total fiber daily;Understanding of distribution of calorie intake throughout the day with the consumption of 4-5 meals/snacks    Hypertension Yes   kidney disease   Intervention Provide education on lifestyle  modifcations including regular physical activity/exercise, weight management, moderate sodium restriction and increased consumption of fresh fruit, vegetables, and low fat dairy, alcohol moderation, and smoking cessation.;Monitor prescription use compliance.    Expected Outcomes Short Term: Continued assessment and intervention until BP is < 140/9020mG in hypertensive participants. < 130/70m49m in hypertensive participants with diabetes, heart failure or chronic kidney disease.;Long Term: Maintenance of blood pressure at goal levels.    Lipids Yes    Intervention Provide education and support for participant on nutrition & aerobic/resistive exercise along with prescribed medications to achieve LDL <70mg37mL >40mg.78mExpected Outcomes Short Term: Participant states understanding of desired cholesterol values and is compliant with medications prescribed. Participant is following exercise prescription and nutrition guidelines.;Long Term: Cholesterol controlled with medications as prescribed, with individualized exercise RX and with personalized nutrition plan. Value goals: LDL < 70mg, 48m> 40 mg.             Tobacco Use Initial Evaluation: Social History   Tobacco Use  Smoking Status Former   Packs/day: 2.00   Years: 30.00   Total pack years: 60.00   Types: Cigarettes   Quit date: 12/09/1983   Years since quitting: 39.0  Smokeless Tobacco Never    Exercise Goals and Review:  Exercise Goals     Row Name 12/20/22 1716             Exercise Goals   Increase Physical Activity Yes       Intervention Provide advice, education, support and counseling about physical activity/exercise needs.;Develop an individualized exercise prescription for aerobic and resistive training based on initial evaluation findings, risk stratification, comorbidities and participant's personal goals.       Expected Outcomes Short Term: Attend rehab on a regular basis to increase amount of physical  activity.;Long Term: Add in home exercise to make exercise part of routine and to increase amount of physical activity.;Long Term: Exercising regularly at least 3-5 days a week.       Increase Strength and Stamina Yes       Intervention Provide advice, education, support and counseling about physical activity/exercise needs.;Develop an individualized exercise prescription for aerobic and resistive training based on initial evaluation findings, risk stratification, comorbidities and participant's personal goals.       Expected Outcomes Short Term: Increase workloads from initial exercise prescription for resistance, speed, and METs.;Short Term: Perform resistance training exercises routinely during rehab and add in resistance training at home;Long Term: Improve cardiorespiratory fitness, muscular endurance and strength as measured by increased METs and functional capacity (6MWT)       Able to understand and use rate of perceived exertion (RPE) scale Yes       Intervention Provide education and explanation on how to use RPE scale  Expected Outcomes Short Term: Able to use RPE daily in rehab to express subjective intensity level;Long Term:  Able to use RPE to guide intensity level when exercising independently       Able to understand and use Dyspnea scale Yes       Intervention Provide education and explanation on how to use Dyspnea scale       Expected Outcomes Short Term: Able to use Dyspnea scale daily in rehab to express subjective sense of shortness of breath during exertion;Long Term: Able to use Dyspnea scale to guide intensity level when exercising independently       Knowledge and understanding of Target Heart Rate Range (THRR) Yes       Intervention Provide education and explanation of THRR including how the numbers were predicted and where they are located for reference       Expected Outcomes Short Term: Able to state/look up THRR;Long Term: Able to use THRR to govern intensity when  exercising independently;Short Term: Able to use daily as guideline for intensity in rehab       Able to check pulse independently Yes       Intervention Provide education and demonstration on how to check pulse in carotid and radial arteries.;Review the importance of being able to check your own pulse for safety during independent exercise       Expected Outcomes Long Term: Able to check pulse independently and accurately;Short Term: Able to explain why pulse checking is important during independent exercise       Understanding of Exercise Prescription Yes       Intervention Provide education, explanation, and written materials on patient's individual exercise prescription       Expected Outcomes Short Term: Able to explain program exercise prescription;Long Term: Able to explain home exercise prescription to exercise independently                Copy of goals given to participant.

## 2022-12-20 NOTE — Progress Notes (Signed)
Pulmonary Individual Treatment Plan  Patient Details  Name: Kristen Bartlett MRN: [id number] Date of Birth: [birth year] Referring Provider:   Flowsheet Row Pulmonary Rehab from 12/20/2022 in [facility] Cardiac and Pulmonary Rehab  Referring Provider Mina Marble, Tai MD       Initial Encounter Date:  Flowsheet Row Pulmonary Rehab from 12/20/2022 in [facility] Cardiac and Pulmonary Rehab  Date 12/20/22       Visit Diagnosis: Bronchiectasis without complication ([place])  Patient's Home Medications on Admission:  Current Outpatient Medications:    acetaminophen (TYLENOL) 500 MG tablet, Take 1,500 mg by mouth every 6 (six) hours as needed for moderate pain., Disp: , Rfl:    acyclovir (ZOVIRAX) 200 MG capsule, Take 1 capsule (200 mg total) by mouth daily. (Patient not taking: Reported on 12/14/2022), Disp: 90 capsule, Rfl: 1   amLODipine (NORVASC) 10 MG tablet, Take 10 mg by mouth daily., Disp: , Rfl:    aspirin 81 MG tablet, Take 81 mg by mouth daily. (Patient not taking: Reported on 12/14/2022), Disp: , Rfl:    Calcium Carb-Cholecalciferol (CALCIUM + D3 PO), Take 600 mg by mouth 2 (two) times daily. (Patient not taking: Reported on 12/14/2022), Disp: , Rfl:    ezetimibe (ZETIA) 10 MG tablet, Take 1 tablet by mouth daily., Disp: , Rfl:    famotidine (PEPCID) 20 MG tablet, Take 20 mg by mouth 2 (two) times daily., Disp: , Rfl:    furosemide (LASIX) 20 MG tablet, Take 20 mg by mouth daily as needed., Disp: , Rfl:    levothyroxine (SYNTHROID, LEVOTHROID) 50 MCG tablet, Take 1 tablet (50 mcg total) by mouth daily., Disp: 90 tablet, Rfl: 1   losartan (COZAAR) 50 MG tablet, Take 50 mg by mouth daily., Disp: , Rfl:    Multiple Vitamin (MULTIVITAMIN) tablet, Take 1 tablet by mouth daily. (Patient not taking: Reported on 12/14/2022), Disp: , Rfl:    pantoprazole (PROTONIX) 40 MG tablet, Take 40 mg by mouth daily. (Patient not taking: Reported on 12/14/2022), Disp: , Rfl:    ranitidine (ZANTAC) 150 MG capsule, Take 150 mg by mouth  daily. (Patient not taking: Reported on 12/14/2022), Disp: , Rfl:   Past Medical History: Past Medical History:  Diagnosis Date   Arthritis    GERD (gastroesophageal reflux disease)    Heart murmur    Hypercholesterolemia    Hypertension    Hypothyroidism    Syncope and collapse    Thin basement membrane disease    Thin basement membrane disease    Thin basement membrane disease     Tobacco Use: Social History   Tobacco Use  Smoking Status Former   Packs/day: 2.00   Years: 30.00   Total pack years: 60.00   Types: Cigarettes   Quit date: 12/09/1983   Years since quitting: 39.0  Smokeless Tobacco Never    Labs: Review Flowsheet       Latest Ref Rng & Units 07/28/2014  Labs for ITP Cardiac and Pulmonary Rehab  Cholestrol 0 - 200 mg/dL 211      LDL (calc) mg/dL 121      HDL-C 35 - 70 mg/dL 75      Trlycerides 40 - 160 mg/dL 76        Details       This result is from an external source.          Pulmonary Assessment Scores:  Pulmonary Assessment Scores     Row Name 12/20/22 1640         ADL  UCSD   ADL Phase Entry     SOB Score total 17     Rest 0     Walk 0     Stairs 3     Bath 0     Dress 0     Shop 1       CAT Score   CAT Score 18       mMRC Score   mMRC Score 1              UCSD: Self-administered rating of dyspnea associated with activities of daily living (ADLs) 6-point scale (0 = "not at all" to 5 = "maximal or unable to do because of breathlessness")  Scoring Scores range from 0 to 120.  Minimally important difference is 5 units  CAT: CAT can identify the health impairment of COPD patients and is better correlated with disease progression.  CAT has a scoring range of zero to 40. The CAT score is classified into four groups of low (less than 10), medium (10 - 20), high (21-30) and very high (31-40) based on the impact level of disease on health status. A CAT score over 10 suggests significant symptoms.  A worsening CAT score could  be explained by an exacerbation, poor medication adherence, poor inhaler technique, or progression of COPD or comorbid conditions.  CAT MCID is 2 points  mMRC: mMRC (Modified Medical Research Council) Dyspnea Scale is used to assess the degree of baseline functional disability in patients of respiratory disease due to dyspnea. No minimal important difference is established. A decrease in score of 1 point or greater is considered a positive change.   Pulmonary Function Assessment:   Exercise Target Goals: Exercise Program Goal: Individual exercise prescription set using results from initial 6 min walk test and THRR while considering  patient's activity barriers and safety.   Exercise Prescription Goal: Initial exercise prescription builds to 30-45 minutes a day of aerobic activity, 2-3 days per week.  Home exercise guidelines will be given to patient during program as part of exercise prescription that the participant will acknowledge.  Education: Aerobic Exercise: - Group verbal and visual presentation on the components of exercise prescription. Introduces F.I.T.T principle from ACSM for exercise prescriptions.  Reviews F.I.T.T. principles of aerobic exercise including progression. Written material given at graduation. Flowsheet Row Pulmonary Rehab from 12/20/2022 in [facility] Cardiac and Pulmonary Rehab  Education need identified 12/20/22       Education: Resistance Exercise: - Group verbal and visual presentation on the components of exercise prescription. Introduces F.I.T.T principle from ACSM for exercise prescriptions  Reviews F.I.T.T. principles of resistance exercise including progression. Written material given at graduation.    Education: Exercise & Equipment Safety: - Individual verbal instruction and demonstration of equipment use and safety with use of the equipment. Flowsheet Row Pulmonary Rehab from 12/20/2022 in [facility] Cardiac and Pulmonary Rehab  Education need identified  12/20/22  Date 12/20/22  Educator KW  Instruction Review Code 1- Verbalizes Understanding       Education: Exercise Physiology & General Exercise Guidelines: - Group verbal and written instruction with models to review the exercise physiology of the cardiovascular system and associated critical values. Provides general exercise guidelines with specific guidelines to those with heart or lung disease.  Flowsheet Row Pulmonary Rehab from 12/20/2022 in [facility] Cardiac and Pulmonary Rehab  Education need identified 12/20/22       Education: Flexibility, Balance, Mind/Body Relaxation: - Group verbal and visual presentation with interactive activity on the components  of exercise prescription. Introduces F.I.T.T principle from ACSM for exercise prescriptions. Reviews F.I.T.T. principles of flexibility and balance exercise training including progression. Also discusses the mind body connection.  Reviews various relaxation techniques to help reduce and manage stress (i.e. Deep breathing, progressive muscle relaxation, and visualization). Balance handout provided to take home. Written material given at graduation.   Activity Barriers & Risk Stratification:  Activity Barriers & Cardiac Risk Stratification - 12/20/22 1647       Activity Barriers & Cardiac Risk Stratification   Activity Barriers Arthritis;Other (comment);Deconditioning;Muscular Weakness    Comments Scoliosis and arthiritis of spine             6 Minute Walk:  6 Minute Walk     Row Name 12/20/22 1649         6 Minute Walk   Phase Initial     Distance 1130 feet     Walk Time 6 minutes     # of Rest Breaks 0     MPH 2.14     METS 1.83     RPE 13     Perceived Dyspnea  1     VO2 Peak 6.41     Symptoms Yes (comment)     Comments Fatigue     Resting HR 71 bpm     Resting BP 110/62     Resting Oxygen Saturation  98 %     Exercise Oxygen Saturation  during 6 min walk 97 %     Max Ex. HR 93 bpm     Max Ex. BP 124/62      2 Minute Post BP 112/62       Interval HR   1 Minute HR 84     2 Minute HR 90     3 Minute HR 93     4 Minute HR 91     5 Minute HR 92     6 Minute HR 89     2 Minute Post HR 69     Interval Heart Rate? Yes       Interval Oxygen   Interval Oxygen? Yes     Baseline Oxygen Saturation % 98 %     1 Minute Oxygen Saturation % 98 %     1 Minute Liters of Oxygen 0 L  RA     2 Minute Oxygen Saturation % 97 %     2 Minute Liters of Oxygen 0 L     3 Minute Oxygen Saturation % 98 %     3 Minute Liters of Oxygen 0 L     4 Minute Oxygen Saturation % 99 %     4 Minute Liters of Oxygen 0 L     5 Minute Oxygen Saturation % 98 %     5 Minute Liters of Oxygen 0 L     6 Minute Oxygen Saturation % 99 %     6 Minute Liters of Oxygen 0 L     2 Minute Post Oxygen Saturation % 98 %     2 Minute Post Liters of Oxygen 0 L             Oxygen Initial Assessment:  Oxygen Initial Assessment - 12/20/22 1640       Home Oxygen   Home Oxygen Device None    Sleep Oxygen Prescription None    Home Exercise Oxygen Prescription None    Home Resting Oxygen Prescription None      Initial 6 min  Walk   Oxygen Used None      Program Oxygen Prescription   Program Oxygen Prescription None      Intervention   Short Term Goals To learn and understand importance of monitoring SPO2 with pulse oximeter and demonstrate accurate use of the pulse oximeter.;To learn and understand importance of maintaining oxygen saturations>88%;To learn and demonstrate proper pursed lip breathing techniques or other breathing techniques.     Long  Term Goals Maintenance of O2 saturations>88%;Exhibits proper breathing techniques, such as pursed lip breathing or other method taught during program session             Oxygen Re-Evaluation:   Oxygen Discharge (Final Oxygen Re-Evaluation):   Initial Exercise Prescription:  Initial Exercise Prescription - 12/20/22 1700       Date of Initial Exercise RX and Referring  Provider   Date 12/20/22    Referring Provider Mina Marble, Tai MD      Oxygen   Maintain Oxygen Saturation 88% or higher      Treadmill   MPH 1.6    Grade 0    Minutes 15    METs 2.23      NuStep   Level 1    SPM 80    Minutes 15    METs 1.8      Biostep-RELP   Level 1    SPM 50    Minutes 15    METs 1.8      Track   Laps 20    Minutes 15    METs 2.09      Prescription Details   Frequency (times per week) 3    Duration Progress to 30 minutes of continuous aerobic without signs/symptoms of physical distress      Intensity   THRR 40-80% of Max Heartrate 97 - 123    Ratings of Perceived Exertion 11-13    Perceived Dyspnea 0-4      Progression   Progression Continue to progress workloads to maintain intensity without signs/symptoms of physical distress.      Resistance Training   Training Prescription Yes    Weight 2 lb    Reps 10-15             Perform Capillary Blood Glucose checks as needed.  Exercise Prescription Changes:   Exercise Prescription Changes     Row Name 12/20/22 1700             Response to Exercise   Blood Pressure (Admit) 110/62       Blood Pressure (Exercise) 124/62       Blood Pressure (Exit) 112/62       Heart Rate (Admit) 71 bpm       Heart Rate (Exercise) 93 bpm       Heart Rate (Exit) 76 bpm       Oxygen Saturation (Admit) 98 %       Oxygen Saturation (Exercise) 97 %       Oxygen Saturation (Exit) 98 %       Rating of Perceived Exertion (Exercise) 13       Perceived Dyspnea (Exercise) 1       Symptoms Fatigue       Comments walk test results                Exercise Comments:   Exercise Goals and Review:   Exercise Goals     Row Name 12/20/22 [phone]  Exercise Goals   Increase Physical Activity Yes       Intervention Provide advice, education, support and counseling about physical activity/exercise needs.;Develop an individualized exercise prescription for aerobic and resistive training based on  initial evaluation findings, risk stratification, comorbidities and participant's personal goals.       Expected Outcomes Short Term: Attend rehab on a regular basis to increase amount of physical activity.;Long Term: Add in home exercise to make exercise part of routine and to increase amount of physical activity.;Long Term: Exercising regularly at least 3-5 days a week.       Increase Strength and Stamina Yes       Intervention Provide advice, education, support and counseling about physical activity/exercise needs.;Develop an individualized exercise prescription for aerobic and resistive training based on initial evaluation findings, risk stratification, comorbidities and participant's personal goals.       Expected Outcomes Short Term: Increase workloads from initial exercise prescription for resistance, speed, and METs.;Short Term: Perform resistance training exercises routinely during rehab and add in resistance training at home;Long Term: Improve cardiorespiratory fitness, muscular endurance and strength as measured by increased METs and functional capacity (6MWT)       Able to understand and use rate of perceived exertion (RPE) scale Yes       Intervention Provide education and explanation on how to use RPE scale       Expected Outcomes Short Term: Able to use RPE daily in rehab to express subjective intensity level;Long Term:  Able to use RPE to guide intensity level when exercising independently       Able to understand and use Dyspnea scale Yes       Intervention Provide education and explanation on how to use Dyspnea scale       Expected Outcomes Short Term: Able to use Dyspnea scale daily in rehab to express subjective sense of shortness of breath during exertion;Long Term: Able to use Dyspnea scale to guide intensity level when exercising independently       Knowledge and understanding of Target Heart Rate Range (THRR) Yes       Intervention Provide education and explanation of THRR  including how the numbers were predicted and where they are located for reference       Expected Outcomes Short Term: Able to state/look up THRR;Long Term: Able to use THRR to govern intensity when exercising independently;Short Term: Able to use daily as guideline for intensity in rehab       Able to check pulse independently Yes       Intervention Provide education and demonstration on how to check pulse in carotid and radial arteries.;Review the importance of being able to check your own pulse for safety during independent exercise       Expected Outcomes Long Term: Able to check pulse independently and accurately;Short Term: Able to explain why pulse checking is important during independent exercise       Understanding of Exercise Prescription Yes       Intervention Provide education, explanation, and written materials on patient's individual exercise prescription       Expected Outcomes Short Term: Able to explain program exercise prescription;Long Term: Able to explain home exercise prescription to exercise independently                Exercise Goals Re-Evaluation :   Discharge Exercise Prescription (Final Exercise Prescription Changes):  Exercise Prescription Changes - 12/20/22 1700       Response to Exercise   Blood Pressure (  Admit) 110/62    Blood Pressure (Exercise) 124/62    Blood Pressure (Exit) 112/62    Heart Rate (Admit) 71 bpm    Heart Rate (Exercise) 93 bpm    Heart Rate (Exit) 76 bpm    Oxygen Saturation (Admit) 98 %    Oxygen Saturation (Exercise) 97 %    Oxygen Saturation (Exit) 98 %    Rating of Perceived Exertion (Exercise) 13    Perceived Dyspnea (Exercise) 1    Symptoms Fatigue    Comments walk test results             Nutrition:  Target Goals: Understanding of nutrition guidelines, daily intake of sodium <1544m, cholesterol <2072m calories 30% from fat and 7% or less from saturated fats, daily to have 5 or more servings of fruits and  vegetables.  Education: All About Nutrition: -Group instruction provided by verbal, written material, interactive activities, discussions, models, and posters to present general guidelines for heart healthy nutrition including fat, fiber, MyPlate, the role of sodium in heart healthy nutrition, utilization of the nutrition label, and utilization of this knowledge for meal planning. Follow up email sent as well. Written material given at graduation.   Biometrics:  Pre Biometrics - 12/20/22 1649       Pre Biometrics   Height 5' 1.5" (1.562 m)    Weight 129 lb 12.8 oz (58.9 kg)    Waist Circumference 33 inches    Hip Circumference 40.5 inches    Waist to Hip Ratio 0.81 %    BMI (Calculated) 24.13    Single Leg Stand 5.7 seconds              Nutrition Therapy Plan and Nutrition Goals:  Nutrition Therapy & Goals - 12/20/22 1641       Nutrition Therapy   RD appointment deferred Yes   Deferred 12/20/22     Intervention Plan   Intervention Prescribe, educate and counsel regarding individualized specific dietary modifications aiming towards targeted core components such as weight, hypertension, lipid management, diabetes, heart failure and other comorbidities.    Expected Outcomes Short Term Goal: Understand basic principles of dietary content, such as calories, fat, sodium, cholesterol and nutrients.;Short Term Goal: A plan has been developed with personal nutrition goals set during dietitian appointment.;Long Term Goal: Adherence to prescribed nutrition plan.             Nutrition Assessments:  MEDIFICTS Score Key: ?70 Need to make dietary changes  40-70 Heart Healthy Diet ? 40 Therapeutic Level Cholesterol Diet  [facility]  Picture Your Plate Total Score on Admission 74      Picture Your Plate Scores: <[id number] dietary pattern with much room for improvement. 41-50 Dietary pattern unlikely to  meet recommendations for good health and room for improvement. 51-60 More healthful dietary pattern, with some room for improvement.  >60 Healthy dietary pattern, although there may be some specific behaviors that could be improved.   Nutrition Goals Re-Evaluation:   Nutrition Goals Discharge (Final Nutrition Goals Re-Evaluation):   Psychosocial: Target Goals: Acknowledge presence or absence of significant depression and/or stress, maximize coping skills, provide positive support system. Participant is able to verbalize types and ability to use techniques and skills needed for reducing stress and depression.   Education: Stress, Anxiety, and Depression - Group verbal and visual presentation to define topics covered.  Reviews how body is impacted by stress, anxiety, and depression.  Also discusses  healthy ways to reduce stress and to treat/manage anxiety and depression.  Written material given at graduation.   Education: Sleep Hygiene -Provides group verbal and written instruction about how sleep can affect your health.  Define sleep hygiene, discuss sleep cycles and impact of sleep habits. Review good sleep hygiene tips.    Initial Review & Psychosocial Screening:  Initial Psych Review & Screening - 12/14/22 1505       Initial Review   Current issues with None Identified      Family Dynamics   Good Support System? Yes   2 close friends,  boss at work     Barriers   Psychosocial barriers to participate in program There are no identifiable barriers or psychosocial needs.      Screening Interventions   Interventions Encouraged to exercise;To provide support and resources with identified psychosocial needs;Provide feedback about the scores to participant    Expected Outcomes Short Term goal: Utilizing psychosocial counselor, staff and physician to assist with identification of specific Stressors or current issues interfering with healing process. Setting desired goal for each stressor  or current issue identified.;Long Term Goal: Stressors or current issues are controlled or eliminated.;Short Term goal: Identification and review with participant of any Quality of Life or Depression concerns found by scoring the questionnaire.;Long Term goal: The participant improves quality of Life and PHQ9 Scores as seen by post scores and/or verbalization of changes             Quality of Life Scores:  Scores of 19 and below usually indicate a poorer quality of life in these areas.  A difference of  2-3 points is a clinically meaningful difference.  A difference of 2-3 points in the total score of the Quality of Life Index has been associated with significant improvement in overall quality of life, self-image, physical symptoms, and general health in studies assessing change in quality of life.  PHQ-9: Review Flowsheet       12/20/2022 03/24/2014 03/19/2013 01/19/2013  Depression screen PHQ 2/9  Decreased Interest 0 0 0 0  Down, Depressed, Hopeless 0 0 0 0  PHQ - 2 Score 0 0 0 0  Altered sleeping 2 - - -  Tired, decreased energy 0 - - -  Change in appetite 1 - - -  Feeling bad or failure about yourself  0 - - -  Trouble concentrating 0 - - -  Moving slowly or fidgety/restless 0 - - -  Suicidal thoughts 0 - - -  PHQ-9 Score 3 - - -  Difficult doing work/chores Not difficult at all - - -   Interpretation of Total Score  Total Score Depression Severity:  1-4 = Minimal depression, 5-9 = Mild depression, 10-14 = Moderate depression, 15-19 = Moderately severe depression, 20-27 = Severe depression   Psychosocial Evaluation and Intervention:  Psychosocial Evaluation - 12/14/22 1521       Psychosocial Evaluation & Interventions   Interventions Encouraged to exercise with the program and follow exercise prescription    Comments Kristen Bartlett has no barriers to attending the program. She continues to work and lives alone. She has 2 friends that are her support and her boss at work supports her  too. She wants to work on improving her exercise regimen as she has never exercised on a routine basis ever.   She does have scoliosis and some arthritis. She does not feel that will hinder her exercising. She is ready to get started.    Expected Outcomes STG  Kristen Bartlett attends all scheduled sessions, she is able to progress with her exercise . LTG Kristen Bartlett continues her exercise progression after discharge             Psychosocial Re-Evaluation:   Psychosocial Discharge (Final Psychosocial Re-Evaluation):   Education: Education Goals: Education classes will be provided on a weekly basis, covering required topics. Participant will state understanding/return demonstration of topics presented.  Learning Barriers/Preferences:   General Pulmonary Education Topics:  Infection Prevention: - Provides verbal and written material to individual with discussion of infection control including proper hand washing and proper equipment cleaning during exercise session. Flowsheet Row Pulmonary Rehab from 12/20/2022 in [facility] Cardiac and Pulmonary Rehab  Education need identified 12/20/22  Date 12/20/22  Educator KW  Instruction Review Code 1- Verbalizes Understanding       Falls Prevention: - Provides verbal and written material to individual with discussion of falls prevention and safety. Flowsheet Row Pulmonary Rehab from 12/14/2022 in [facility] Cardiac and Pulmonary Rehab  Date 12/14/22  Educator SB  Instruction Review Code 1- Verbalizes Understanding       Chronic Lung Disease Review: - Group verbal instruction with posters, models, PowerPoint presentations and videos,  to review new updates, new respiratory medications, new advancements in procedures and treatments. Providing information on websites and "800" numbers for continued self-education. Includes information about supplement oxygen, available portable oxygen systems, continuous and intermittent flow rates, oxygen safety, concentrators, and  Medicare reimbursement for oxygen. Explanation of Pulmonary Drugs, including class, frequency, complications, importance of spacers, rinsing mouth after steroid MDI's, and proper cleaning methods for nebulizers. Review of basic lung anatomy and physiology related to function, structure, and complications of lung disease. Review of risk factors. Discussion about methods for diagnosing sleep apnea and types of masks and machines for OSA. Includes a review of the use of types of environmental controls: home humidity, furnaces, filters, dust mite/pet prevention, HEPA vacuums. Discussion about weather changes, air quality and the benefits of nasal washing. Instruction on Warning signs, infection symptoms, calling MD promptly, preventive modes, and value of vaccinations. Review of effective airway clearance, coughing and/or vibration techniques. Emphasizing that all should Create an Action Plan. Written material given at graduation. Flowsheet Row Pulmonary Rehab from 12/20/2022 in [facility] Cardiac and Pulmonary Rehab  Education need identified 12/20/22       AED/CPR: - Group verbal and written instruction with the use of models to demonstrate the basic use of the AED with the basic ABC's of resuscitation.    Anatomy and Cardiac Procedures: - Group verbal and visual presentation and models provide information about basic cardiac anatomy and function. Reviews the testing methods done to diagnose heart disease and the outcomes of the test results. Describes the treatment choices: Medical Management, Angioplasty, or Coronary Bypass Surgery for treating various heart conditions including Myocardial Infarction, Angina, Valve Disease, and Cardiac Arrhythmias.  Written material given at graduation.   Medication Safety: - Group verbal and visual instruction to review commonly prescribed medications for heart and lung disease. Reviews the medication, class of the drug, and side effects. Includes the steps to properly  store meds and maintain the prescription regimen.  Written material given at graduation.   Other: -Provides group and verbal instruction on various topics (see comments)   Knowledge Questionnaire Score:  Knowledge Questionnaire Score - 12/20/22 1637       Knowledge Questionnaire Score   Pre Score 12/18              Core Components/Risk Factors/Patient Goals  at Admission:  Personal Goals and Risk Factors at Admission - 12/20/22 1716       Core Components/Risk Factors/Patient Goals on Admission    Weight Management Yes;Weight Maintenance    Intervention Weight Management: Develop a combined nutrition and exercise program designed to reach desired caloric intake, while maintaining appropriate intake of nutrient and fiber, sodium and fats, and appropriate energy expenditure required for the weight goal.;Weight Management: Provide education and appropriate resources to help participant work on and attain dietary goals.;Weight Management/Obesity: Establish reasonable short term and long term weight goals.    Admit Weight 129 lb (58.5 kg)    Goal Weight: Short Term 129 lb (58.5 kg)    Goal Weight: Long Term 129 lb (58.5 kg)    Expected Outcomes Short Term: Continue to assess and modify interventions until short term weight is achieved;Long Term: Adherence to nutrition and physical activity/exercise program aimed toward attainment of established weight goal;Weight Maintenance: Understanding of the daily nutrition guidelines, which includes 25-35% calories from fat, 7% or less cal from saturated fats, less than 244m cholesterol, less than 1.5gm of sodium, & 5 or more servings of fruits and vegetables daily;Understanding recommendations for meals to include 15-35% energy as protein, 25-35% energy from fat, 35-60% energy from carbohydrates, less than 2014mof dietary cholesterol, 20-35 gm of total fiber daily;Understanding of distribution of calorie intake throughout the day with the consumption  of 4-5 meals/snacks    Hypertension Yes   kidney disease   Intervention Provide education on lifestyle modifcations including regular physical activity/exercise, weight management, moderate sodium restriction and increased consumption of fresh fruit, vegetables, and low fat dairy, alcohol moderation, and smoking cessation.;Monitor prescription use compliance.    Expected Outcomes Short Term: Continued assessment and intervention until BP is < 140/9020mG in hypertensive participants. < 130/90m20m in hypertensive participants with diabetes, heart failure or chronic kidney disease.;Long Term: Maintenance of blood pressure at goal levels.    Lipids Yes    Intervention Provide education and support for participant on nutrition & aerobic/resistive exercise along with prescribed medications to achieve LDL <70mg10mL >40mg.48mExpected Outcomes Short Term: Participant states understanding of desired cholesterol values and is compliant with medications prescribed. Participant is following exercise prescription and nutrition guidelines.;Long Term: Cholesterol controlled with medications as prescribed, with individualized exercise RX and with personalized nutrition plan. Value goals: LDL < 70mg, 49m> 40 mg.             Education:Diabetes - Individual verbal and written instruction to review signs/symptoms of diabetes, desired ranges of glucose level fasting, after meals and with exercise. Acknowledge that pre and post exercise glucose checks will be done for 3 sessions at entry of program.   Know Your Numbers and Heart Failure: - Group verbal and visual instruction to discuss disease risk factors for cardiac and pulmonary disease and treatment options.  Reviews associated critical values for Overweight/Obesity, Hypertension, Cholesterol, and Diabetes.  Discusses basics of heart failure: signs/symptoms and treatments.  Introduces Heart Failure Zone chart for action plan for heart failure.  Written material  given at graduation.   Core Components/Risk Factors/Patient Goals Review:    Core Components/Risk Factors/Patient Goals at Discharge (Final Review):    ITP Comments:  ITP Comments     Row Name 12/14/22 1520 12/20/22 1639         ITP Comments Virtual orientation call completed today. shehas an appointment on Date: 12/20/2022  for EP eval and gym Orientation.  Documentation of  diagnosis can be found in [facility] Date: [id number] . Completed 6MWT and gym orientation. Initial ITP created and sent for review to Dr. Ottie Glazier, Medical Director.               Comments: Initial ITP

## 2022-12-25 ENCOUNTER — Encounter: Payer: HMO | Admitting: *Deleted

## 2022-12-25 DIAGNOSIS — J439 Emphysema, unspecified: Secondary | ICD-10-CM

## 2022-12-25 DIAGNOSIS — J479 Bronchiectasis, uncomplicated: Secondary | ICD-10-CM

## 2022-12-25 NOTE — Progress Notes (Signed)
Daily Session Note  Patient Details  Name: Kristen Bartlett MRN: [id number] Date of Birth: 01/05/1941 Referring Provider:   Flowsheet Row Pulmonary Rehab from 12/20/2022 in [facility] Cardiac and Pulmonary Rehab  Referring Provider Kirke Shaggy MD       Encounter Date: 12/25/2022  Check In:  Session Check In - 12/25/22 1014       Check-In   Supervising physician immediately available to respond to emergencies See telemetry face sheet for immediately available ER MD    Location [facility]-Cardiac & Pulmonary Rehab    Staff Present Renita Papa, RN Odelia Gage, RN, Dimple Nanas, BS, Exercise Physiologist;Kelly Amedeo Plenty, BS, ACSM CEP, Exercise Physiologist    Virtual Visit No    Medication changes reported     No    Fall or balance concerns reported    No    Warm-up and Cool-down Performed on first and last piece of equipment    Resistance Training Performed Yes    VAD Patient? No    PAD/SET Patient? No      Pain Assessment   Currently in Pain? No/denies                Social History   Tobacco Use  Smoking Status Former   Packs/day: 2.00   Years: 30.00   Total pack years: 60.00   Types: Cigarettes   Quit date: 12/09/1983   Years since quitting: 39.0  Smokeless Tobacco Never    Goals Met:  Independence with exercise equipment Exercise tolerated well No report of concerns or symptoms today Strength training completed today  Goals Unmet:  Not Applicable  Comments: First full day of exercise!  Patient was oriented to gym and equipment including functions, settings, policies, and procedures.  Patient's individual exercise prescription and treatment plan were reviewed.  All starting workloads were established based on the results of the 6 minute walk test done at initial orientation visit.  The plan for exercise progression was also introduced and progression will be customized based on patient's performance and goals.    Dr. Emily Filbert is Medical Director for [facility].  Dr. Ottie Glazier is Medical Director for [facility] Pulmonary Rehabilitation.

## 2022-12-27 ENCOUNTER — Encounter: Payer: HMO | Admitting: *Deleted

## 2022-12-27 ENCOUNTER — Encounter: Payer: Self-pay | Admitting: *Deleted

## 2022-12-27 DIAGNOSIS — J479 Bronchiectasis, uncomplicated: Secondary | ICD-10-CM

## 2022-12-27 DIAGNOSIS — J439 Emphysema, unspecified: Secondary | ICD-10-CM | POA: Diagnosis not present

## 2022-12-27 NOTE — Progress Notes (Signed)
Daily Session Note  Patient Details  Name: Kristen Bartlett MRN: [id number] Date of Birth: [birth year] Referring Provider:   Flowsheet Row Pulmonary Rehab from 12/20/2022 in [facility] Cardiac and Pulmonary Rehab  Referring Provider Kirke Shaggy MD       Encounter Date: 12/27/2022  Check In:  Session Check In - 12/27/22 0729       Check-In   Supervising physician immediately available to respond to emergencies See telemetry face sheet for immediately available ER MD    Location [facility]-Cardiac & Pulmonary Rehab    Staff Present Darlyne Russian, RN, ADN;Joseph Tessie Fass, RCP,RRT,BSRT;Noah Tickle, BS, Exercise Physiologist    Virtual Visit No    Medication changes reported     No    Fall or balance concerns reported    No    Warm-up and Cool-down Performed on first and last piece of equipment    Resistance Training Performed Yes    VAD Patient? No    PAD/SET Patient? No      Pain Assessment   Currently in Pain? No/denies                Social History   Tobacco Use  Smoking Status Former   Packs/day: 2.00   Years: 30.00   Total pack years: 60.00   Types: Cigarettes   Quit date: 12/09/1983   Years since quitting: 39.0  Smokeless Tobacco Never    Goals Met:  Independence with exercise equipment Exercise tolerated well No report of concerns or symptoms today Strength training completed today  Goals Unmet:  Not Applicable  Comments: Pt able to follow exercise prescription today without complaint.  Will continue to monitor for progression.    Dr. Emily Filbert is Medical Director for [facility].  Dr. Ottie Glazier is Medical Director for [facility] Pulmonary Rehabilitation.

## 2022-12-27 NOTE — Progress Notes (Signed)
Pulmonary Individual Treatment Plan  Patient Details  Name: Kristen Bartlett MRN: [id number] Date of Birth: 1941/02/22 Referring Provider:   Flowsheet Row Pulmonary Rehab from 12/20/2022 in [facility] Cardiac and Pulmonary Rehab  Referring Provider Mina Marble, Tai MD       Initial Encounter Date:  Flowsheet Row Pulmonary Rehab from 12/20/2022 in [facility] Cardiac and Pulmonary Rehab  Date 12/20/22       Visit Diagnosis: Pulmonary emphysema, unspecified emphysema type ([place])  Bronchiectasis without complication ([place])  Patient's Home Medications on Admission:  Current Outpatient Medications:    acetaminophen (TYLENOL) 500 MG tablet, Take 1,500 mg by mouth every 6 (six) hours as needed for moderate pain., Disp: , Rfl:    acyclovir (ZOVIRAX) 200 MG capsule, Take 1 capsule (200 mg total) by mouth daily. (Patient not taking: Reported on 12/14/2022), Disp: 90 capsule, Rfl: 1   amLODipine (NORVASC) 10 MG tablet, Take 10 mg by mouth daily., Disp: , Rfl:    aspirin 81 MG tablet, Take 81 mg by mouth daily. (Patient not taking: Reported on 12/14/2022), Disp: , Rfl:    Calcium Carb-Cholecalciferol (CALCIUM + D3 PO), Take 600 mg by mouth 2 (two) times daily. (Patient not taking: Reported on 12/14/2022), Disp: , Rfl:    ezetimibe (ZETIA) 10 MG tablet, Take 1 tablet by mouth daily., Disp: , Rfl:    famotidine (PEPCID) 20 MG tablet, Take 20 mg by mouth 2 (two) times daily., Disp: , Rfl:    furosemide (LASIX) 20 MG tablet, Take 20 mg by mouth daily as needed., Disp: , Rfl:    levothyroxine (SYNTHROID, LEVOTHROID) 50 MCG tablet, Take 1 tablet (50 mcg total) by mouth daily., Disp: 90 tablet, Rfl: 1   losartan (COZAAR) 50 MG tablet, Take 50 mg by mouth daily., Disp: , Rfl:    Multiple Vitamin (MULTIVITAMIN) tablet, Take 1 tablet by mouth daily. (Patient not taking: Reported on 12/14/2022), Disp: , Rfl:    pantoprazole (PROTONIX) 40 MG tablet, Take 40 mg by mouth daily. (Patient not taking: Reported on 12/14/2022), Disp: , Rfl:     ranitidine (ZANTAC) 150 MG capsule, Take 150 mg by mouth daily. (Patient not taking: Reported on 12/14/2022), Disp: , Rfl:   Past Medical History: Past Medical History:  Diagnosis Date   Arthritis    GERD (gastroesophageal reflux disease)    Heart murmur    Hypercholesterolemia    Hypertension    Hypothyroidism    Syncope and collapse    Thin basement membrane disease    Thin basement membrane disease    Thin basement membrane disease     Tobacco Use: Social History   Tobacco Use  Smoking Status Former   Packs/day: 2.00   Years: 30.00   Total pack years: 60.00   Types: Cigarettes   Quit date: 12/09/1983   Years since quitting: 39.0  Smokeless Tobacco Never    Labs: Review Flowsheet       Latest Ref Rng & Units 07/28/2014  Labs for ITP Cardiac and Pulmonary Rehab  Cholestrol 0 - 200 mg/dL 211      LDL (calc) mg/dL 121      HDL-C 35 - 70 mg/dL 75      Trlycerides 40 - 160 mg/dL 76        Details       This result is from an external source.          Pulmonary Assessment Scores:  Pulmonary Assessment Scores     Row Name 12/20/22 1640  ADL UCSD   ADL Phase Entry     SOB Score total 17     Rest 0     Walk 0     Stairs 3     Bath 0     Dress 0     Shop 1       CAT Score   CAT Score 18       mMRC Score   mMRC Score 1              UCSD: Self-administered rating of dyspnea associated with activities of daily living (ADLs) 6-point scale (0 = "not at all" to 5 = "maximal or unable to do because of breathlessness")  Scoring Scores range from 0 to 120.  Minimally important difference is 5 units  CAT: CAT can identify the health impairment of COPD patients and is better correlated with disease progression.  CAT has a scoring range of zero to 40. The CAT score is classified into four groups of low (less than 10), medium (10 - 20), high (21-30) and very high (31-40) based on the impact level of disease on health status. A CAT score over 10  suggests significant symptoms.  A worsening CAT score could be explained by an exacerbation, poor medication adherence, poor inhaler technique, or progression of COPD or comorbid conditions.  CAT MCID is 2 points  mMRC: mMRC (Modified Medical Research Council) Dyspnea Scale is used to assess the degree of baseline functional disability in patients of respiratory disease due to dyspnea. No minimal important difference is established. A decrease in score of 1 point or greater is considered a positive change.   Pulmonary Function Assessment:   Exercise Target Goals: Exercise Program Goal: Individual exercise prescription set using results from initial 6 min walk test and THRR while considering  patient's activity barriers and safety.   Exercise Prescription Goal: Initial exercise prescription builds to 30-45 minutes a day of aerobic activity, 2-3 days per week.  Home exercise guidelines will be given to patient during program as part of exercise prescription that the participant will acknowledge.  Education: Aerobic Exercise: - Group verbal and visual presentation on the components of exercise prescription. Introduces F.I.T.T principle from ACSM for exercise prescriptions.  Reviews F.I.T.T. principles of aerobic exercise including progression. Written material given at graduation. Flowsheet Row Pulmonary Rehab from 12/27/2022 in [facility] Cardiac and Pulmonary Rehab  Education need identified 12/20/22       Education: Resistance Exercise: - Group verbal and visual presentation on the components of exercise prescription. Introduces F.I.T.T principle from ACSM for exercise prescriptions  Reviews F.I.T.T. principles of resistance exercise including progression. Written material given at graduation.    Education: Exercise & Equipment Safety: - Individual verbal instruction and demonstration of equipment use and safety with use of the equipment. Flowsheet Row Pulmonary Rehab from 12/27/2022 in [facility]  Cardiac and Pulmonary Rehab  Education need identified 12/20/22  Date 12/20/22  Educator KW  Instruction Review Code 1- Verbalizes Understanding       Education: Exercise Physiology & General Exercise Guidelines: - Group verbal and written instruction with models to review the exercise physiology of the cardiovascular system and associated critical values. Provides general exercise guidelines with specific guidelines to those with heart or lung disease.  Flowsheet Row Pulmonary Rehab from 12/27/2022 in [facility] Cardiac and Pulmonary Rehab  Education need identified 12/20/22       Education: Flexibility, Balance, Mind/Body Relaxation: - Group verbal and visual presentation with interactive activity on the  components of exercise prescription. Introduces F.I.T.T principle from ACSM for exercise prescriptions. Reviews F.I.T.T. principles of flexibility and balance exercise training including progression. Also discusses the mind body connection.  Reviews various relaxation techniques to help reduce and manage stress (i.e. Deep breathing, progressive muscle relaxation, and visualization). Balance handout provided to take home. Written material given at graduation.   Activity Barriers & Risk Stratification:  Activity Barriers & Cardiac Risk Stratification - 12/20/22 1647       Activity Barriers & Cardiac Risk Stratification   Activity Barriers Arthritis;Other (comment);Deconditioning;Muscular Weakness    Comments Scoliosis and arthiritis of spine             6 Minute Walk:  6 Minute Walk     Row Name 12/20/22 1649         6 Minute Walk   Phase Initial     Distance 1130 feet     Walk Time 6 minutes     # of Rest Breaks 0     MPH 2.14     METS 1.83     RPE 13     Perceived Dyspnea  1     VO2 Peak 6.41     Symptoms Yes (comment)     Comments Fatigue     Resting HR 71 bpm     Resting BP 110/62     Resting Oxygen Saturation  98 %     Exercise Oxygen Saturation  during 6 min  walk 97 %     Max Ex. HR 93 bpm     Max Ex. BP 124/62     2 Minute Post BP 112/62       Interval HR   1 Minute HR 84     2 Minute HR 90     3 Minute HR 93     4 Minute HR 91     5 Minute HR 92     6 Minute HR 89     2 Minute Post HR 69     Interval Heart Rate? Yes       Interval Oxygen   Interval Oxygen? Yes     Baseline Oxygen Saturation % 98 %     1 Minute Oxygen Saturation % 98 %     1 Minute Liters of Oxygen 0 L  RA     2 Minute Oxygen Saturation % 97 %     2 Minute Liters of Oxygen 0 L     3 Minute Oxygen Saturation % 98 %     3 Minute Liters of Oxygen 0 L     4 Minute Oxygen Saturation % 99 %     4 Minute Liters of Oxygen 0 L     5 Minute Oxygen Saturation % 98 %     5 Minute Liters of Oxygen 0 L     6 Minute Oxygen Saturation % 99 %     6 Minute Liters of Oxygen 0 L     2 Minute Post Oxygen Saturation % 98 %     2 Minute Post Liters of Oxygen 0 L             Oxygen Initial Assessment:  Oxygen Initial Assessment - 12/20/22 1640       Home Oxygen   Home Oxygen Device None    Sleep Oxygen Prescription None    Home Exercise Oxygen Prescription None    Home Resting Oxygen Prescription None      Initial 6  min Walk   Oxygen Used None      Program Oxygen Prescription   Program Oxygen Prescription None      Intervention   Short Term Goals To learn and understand importance of monitoring SPO2 with pulse oximeter and demonstrate accurate use of the pulse oximeter.;To learn and understand importance of maintaining oxygen saturations>88%;To learn and demonstrate proper pursed lip breathing techniques or other breathing techniques.     Long  Term Goals Maintenance of O2 saturations>88%;Exhibits proper breathing techniques, such as pursed lip breathing or other method taught during program session             Oxygen Re-Evaluation:  Oxygen Re-Evaluation     Row Name 12/25/22 1015             Goals/Expected Outcomes   Comments Reviewed PLB technique  with pt.  Talked about how it works and it's importance in maintaining their exercise saturations.       Goals/Expected Outcomes Short: Become more profiecient at using PLB.   Long: Become independent at using PLB.                Oxygen Discharge (Final Oxygen Re-Evaluation):  Oxygen Re-Evaluation - 12/25/22 1015       Goals/Expected Outcomes   Comments Reviewed PLB technique with pt.  Talked about how it works and it's importance in maintaining their exercise saturations.    Goals/Expected Outcomes Short: Become more profiecient at using PLB.   Long: Become independent at using PLB.             Initial Exercise Prescription:  Initial Exercise Prescription - 12/20/22 1700       Date of Initial Exercise RX and Referring Provider   Date 12/20/22    Referring Provider Mina Marble, Tai MD      Oxygen   Maintain Oxygen Saturation 88% or higher      Treadmill   MPH 1.6    Grade 0    Minutes 15    METs 2.23      NuStep   Level 1    SPM 80    Minutes 15    METs 1.8      Biostep-RELP   Level 1    SPM 50    Minutes 15    METs 1.8      Track   Laps 20    Minutes 15    METs 2.09      Prescription Details   Frequency (times per week) 3    Duration Progress to 30 minutes of continuous aerobic without signs/symptoms of physical distress      Intensity   THRR 40-80% of Max Heartrate 97 - 123    Ratings of Perceived Exertion 11-13    Perceived Dyspnea 0-4      Progression   Progression Continue to progress workloads to maintain intensity without signs/symptoms of physical distress.      Resistance Training   Training Prescription Yes    Weight 2 lb    Reps 10-15             Perform Capillary Blood Glucose checks as needed.  Exercise Prescription Changes:   Exercise Prescription Changes     Row Name 12/20/22 1700             Response to Exercise   Blood Pressure (Admit) 110/62       Blood Pressure (Exercise) 124/62       Blood Pressure (Exit)  112/62  Heart Rate (Admit) 71 bpm       Heart Rate (Exercise) 93 bpm       Heart Rate (Exit) 76 bpm       Oxygen Saturation (Admit) 98 %       Oxygen Saturation (Exercise) 97 %       Oxygen Saturation (Exit) 98 %       Rating of Perceived Exertion (Exercise) 13       Perceived Dyspnea (Exercise) 1       Symptoms Fatigue       Comments walk test results                Exercise Comments:   Exercise Comments     Row Name 12/25/22 1015           Exercise Comments First full day of exercise!  Patient was oriented to gym and equipment including functions, settings, policies, and procedures.  Patient's individual exercise prescription and treatment plan were reviewed.  All starting workloads were established based on the results of the 6 minute walk test done at initial orientation visit.  The plan for exercise progression was also introduced and progression will be customized based on patient's performance and goals.                Exercise Goals and Review:   Exercise Goals     Row Name 12/20/22 1716             Exercise Goals   Increase Physical Activity Yes       Intervention Provide advice, education, support and counseling about physical activity/exercise needs.;Develop an individualized exercise prescription for aerobic and resistive training based on initial evaluation findings, risk stratification, comorbidities and participant's personal goals.       Expected Outcomes Short Term: Attend rehab on a regular basis to increase amount of physical activity.;Long Term: Add in home exercise to make exercise part of routine and to increase amount of physical activity.;Long Term: Exercising regularly at least 3-5 days a week.       Increase Strength and Stamina Yes       Intervention Provide advice, education, support and counseling about physical activity/exercise needs.;Develop an individualized exercise prescription for aerobic and resistive training based on  initial evaluation findings, risk stratification, comorbidities and participant's personal goals.       Expected Outcomes Short Term: Increase workloads from initial exercise prescription for resistance, speed, and METs.;Short Term: Perform resistance training exercises routinely during rehab and add in resistance training at home;Long Term: Improve cardiorespiratory fitness, muscular endurance and strength as measured by increased METs and functional capacity (6MWT)       Able to understand and use rate of perceived exertion (RPE) scale Yes       Intervention Provide education and explanation on how to use RPE scale       Expected Outcomes Short Term: Able to use RPE daily in rehab to express subjective intensity level;Long Term:  Able to use RPE to guide intensity level when exercising independently       Able to understand and use Dyspnea scale Yes       Intervention Provide education and explanation on how to use Dyspnea scale       Expected Outcomes Short Term: Able to use Dyspnea scale daily in rehab to express subjective sense of shortness of breath during exertion;Long Term: Able to use Dyspnea scale to guide intensity level when exercising independently  Knowledge and understanding of Target Heart Rate Range (THRR) Yes       Intervention Provide education and explanation of THRR including how the numbers were predicted and where they are located for reference       Expected Outcomes Short Term: Able to state/look up THRR;Long Term: Able to use THRR to govern intensity when exercising independently;Short Term: Able to use daily as guideline for intensity in rehab       Able to check pulse independently Yes       Intervention Provide education and demonstration on how to check pulse in carotid and radial arteries.;Review the importance of being able to check your own pulse for safety during independent exercise       Expected Outcomes Long Term: Able to check pulse independently and  accurately;Short Term: Able to explain why pulse checking is important during independent exercise       Understanding of Exercise Prescription Yes       Intervention Provide education, explanation, and written materials on patient's individual exercise prescription       Expected Outcomes Short Term: Able to explain program exercise prescription;Long Term: Able to explain home exercise prescription to exercise independently                Exercise Goals Re-Evaluation :  Exercise Goals Re-Evaluation     Row Name 12/25/22 1015             Exercise Goal Re-Evaluation   Exercise Goals Review Able to understand and use rate of perceived exertion (RPE) scale;Increase Physical Activity;Knowledge and understanding of Target Heart Rate Range (THRR);Understanding of Exercise Prescription;Increase Strength and Stamina;Able to understand and use Dyspnea scale;Able to check pulse independently       Comments Reviewed RPE scale, THR and program prescription with pt today.  Pt voiced understanding and was given a copy of goals to take home.       Expected Outcomes Short: Use RPE daily to regulate intensity.  Long: Follow program prescription in THR.                Discharge Exercise Prescription (Final Exercise Prescription Changes):  Exercise Prescription Changes - 12/20/22 1700       Response to Exercise   Blood Pressure (Admit) 110/62    Blood Pressure (Exercise) 124/62    Blood Pressure (Exit) 112/62    Heart Rate (Admit) 71 bpm    Heart Rate (Exercise) 93 bpm    Heart Rate (Exit) 76 bpm    Oxygen Saturation (Admit) 98 %    Oxygen Saturation (Exercise) 97 %    Oxygen Saturation (Exit) 98 %    Rating of Perceived Exertion (Exercise) 13    Perceived Dyspnea (Exercise) 1    Symptoms Fatigue    Comments walk test results             Nutrition:  Target Goals: Understanding of nutrition guidelines, daily intake of sodium <1537m, cholesterol <2061m calories 30% from fat and  7% or less from saturated fats, daily to have 5 or more servings of fruits and vegetables.  Education: All About Nutrition: -Group instruction provided by verbal, written material, interactive activities, discussions, models, and posters to present general guidelines for heart healthy nutrition including fat, fiber, MyPlate, the role of sodium in heart healthy nutrition, utilization of the nutrition label, and utilization of this knowledge for meal planning. Follow up email sent as well. Written material given at graduation.   Biometrics:  Pre Biometrics -  12/20/22 1649       Pre Biometrics   Height 5' 1.5" (1.562 m)    Weight 129 lb 12.8 oz (58.9 kg)    Waist Circumference 33 inches    Hip Circumference 40.5 inches    Waist to Hip Ratio 0.81 %    BMI (Calculated) 24.13    Single Leg Stand 5.7 seconds              Nutrition Therapy Plan and Nutrition Goals:  Nutrition Therapy & Goals - 12/20/22 1641       Nutrition Therapy   RD appointment deferred Yes   Deferred 12/20/22     Intervention Plan   Intervention Prescribe, educate and counsel regarding individualized specific dietary modifications aiming towards targeted core components such as weight, hypertension, lipid management, diabetes, heart failure and other comorbidities.    Expected Outcomes Short Term Goal: Understand basic principles of dietary content, such as calories, fat, sodium, cholesterol and nutrients.;Short Term Goal: A plan has been developed with personal nutrition goals set during dietitian appointment.;Long Term Goal: Adherence to prescribed nutrition plan.             Nutrition Assessments:  MEDIFICTS Score Key: ?70 Need to make dietary changes  40-70 Heart Healthy Diet ? 40 Therapeutic Level Cholesterol Diet  Flowsheet Row Pulmonary Rehab from 12/20/2022 in [facility] Cardiac and Pulmonary Rehab  Picture Your Plate Total Score on Admission 74      Picture Your Plate Scores: [id number] Unhealthy dietary  pattern with much room for improvement. 41-50 Dietary pattern unlikely to meet recommendations for good health and room for improvement. 51-60 More healthful dietary pattern, with some room for improvement.  >60 Healthy dietary pattern, although there may be some specific behaviors that could be improved.   Nutrition Goals Re-Evaluation:   Nutrition Goals Discharge (Final Nutrition Goals Re-Evaluation):   Psychosocial: Target Goals: Acknowledge presence or absence of significant depression and/or stress, maximize coping skills, provide positive support system. Participant is able to verbalize types and ability to use techniques and skills needed for reducing stress and depression.   Education: Stress, Anxiety, and Depression - Group verbal and visual presentation to define topics covered.  Reviews how body is impacted by stress, anxiety, and depression.  Also discusses healthy ways to reduce stress and to treat/manage anxiety and depression.  Written material given at graduation.   Education: Sleep Hygiene -Provides group verbal and written instruction about how sleep can affect your health.  Define sleep hygiene, discuss sleep cycles and impact of sleep habits. Review good sleep hygiene tips.    Initial Review & Psychosocial Screening:  Initial Psych Review & Screening - 12/14/22 1505       Initial Review   Current issues with None Identified      Family Dynamics   Good Support System? Yes   2 close friends,  boss at work     Barriers   Psychosocial barriers to participate in program There are no identifiable barriers or psychosocial needs.      Screening Interventions   Interventions Encouraged to exercise;To provide support and resources with identified psychosocial needs;Provide feedback about the scores to participant    Expected Outcomes Short Term goal: Utilizing psychosocial counselor, staff and physician to assist with identification of specific Stressors or current  issues interfering with healing process. Setting desired goal for each stressor or current issue identified.;Long Term Goal: Stressors or current issues are controlled or eliminated.;Short Term goal: Identification and review with participant of  any Quality of Life or Depression concerns found by scoring the questionnaire.;Long Term goal: The participant improves quality of Life and PHQ9 Scores as seen by post scores and/or verbalization of changes             Quality of Life Scores:  Scores of 19 and below usually indicate a poorer quality of life in these areas.  A difference of  2-3 points is a clinically meaningful difference.  A difference of 2-3 points in the total score of the Quality of Life Index has been associated with significant improvement in overall quality of life, self-image, physical symptoms, and general health in studies assessing change in quality of life.  PHQ-9: Review Flowsheet       12/20/2022 03/24/2014 03/19/2013 01/19/2013  Depression screen PHQ 2/9  Decreased Interest 0 0 0 0  Down, Depressed, Hopeless 0 0 0 0  PHQ - 2 Score 0 0 0 0  Altered sleeping 2 - - -  Tired, decreased energy 0 - - -  Change in appetite 1 - - -  Feeling bad or failure about yourself  0 - - -  Trouble concentrating 0 - - -  Moving slowly or fidgety/restless 0 - - -  Suicidal thoughts 0 - - -  PHQ-9 Score 3 - - -  Difficult doing work/chores Not difficult at all - - -   Interpretation of Total Score  Total Score Depression Severity:  1-4 = Minimal depression, 5-9 = Mild depression, 10-14 = Moderate depression, 15-19 = Moderately severe depression, 20-27 = Severe depression   Psychosocial Evaluation and Intervention:  Psychosocial Evaluation - 12/14/22 1521       Psychosocial Evaluation & Interventions   Interventions Encouraged to exercise with the program and follow exercise prescription    Comments Kristen Bartlett has no barriers to attending the program. She continues to work and lives  alone. She has 2 friends that are her support and her boss at work supports her too. She wants to work on improving her exercise regimen as she has never exercised on a routine basis ever.   She does have scoliosis and some arthritis. She does not feel that will hinder her exercising. She is ready to get started.    Expected Outcomes STG Kristen Bartlett attends all scheduled sessions, she is able to progress with her exercise . LTG Kristen Bartlett continues her exercise progression after discharge             Psychosocial Re-Evaluation:   Psychosocial Discharge (Final Psychosocial Re-Evaluation):   Education: Education Goals: Education classes will be provided on a weekly basis, covering required topics. Participant will state understanding/return demonstration of topics presented.  Learning Barriers/Preferences:   General Pulmonary Education Topics:  Infection Prevention: - Provides verbal and written material to individual with discussion of infection control including proper hand washing and proper equipment cleaning during exercise session. Flowsheet Row Pulmonary Rehab from 12/27/2022 in [facility] Cardiac and Pulmonary Rehab  Education need identified 12/20/22  Date 12/20/22  Educator KW  Instruction Review Code 1- Verbalizes Understanding       Falls Prevention: - Provides verbal and written material to individual with discussion of falls prevention and safety. Flowsheet Row Pulmonary Rehab from 12/14/2022 in [facility] Cardiac and Pulmonary Rehab  Date 12/14/22  Educator SB  Instruction Review Code 1- Verbalizes Understanding       Chronic Lung Disease Review: - Group verbal instruction with posters, models, PowerPoint presentations and videos,  to review new updates, new respiratory medications,  new advancements in procedures and treatments. Providing information on websites and "800" numbers for continued self-education. Includes information about supplement oxygen, available portable oxygen  systems, continuous and intermittent flow rates, oxygen safety, concentrators, and Medicare reimbursement for oxygen. Explanation of Pulmonary Drugs, including class, frequency, complications, importance of spacers, rinsing mouth after steroid MDI's, and proper cleaning methods for nebulizers. Review of basic lung anatomy and physiology related to function, structure, and complications of lung disease. Review of risk factors. Discussion about methods for diagnosing sleep apnea and types of masks and machines for OSA. Includes a review of the use of types of environmental controls: home humidity, furnaces, filters, dust mite/pet prevention, HEPA vacuums. Discussion about weather changes, air quality and the benefits of nasal washing. Instruction on Warning signs, infection symptoms, calling MD promptly, preventive modes, and value of vaccinations. Review of effective airway clearance, coughing and/or vibration techniques. Emphasizing that all should Create an Action Plan. Written material given at graduation. Flowsheet Row Pulmonary Rehab from 12/27/2022 in [facility] Cardiac and Pulmonary Rehab  Education need identified 12/20/22       AED/CPR: - Group verbal and written instruction with the use of models to demonstrate the basic use of the AED with the basic ABC's of resuscitation.    Anatomy and Cardiac Procedures: - Group verbal and visual presentation and models provide information about basic cardiac anatomy and function. Reviews the testing methods done to diagnose heart disease and the outcomes of the test results. Describes the treatment choices: Medical Management, Angioplasty, or Coronary Bypass Surgery for treating various heart conditions including Myocardial Infarction, Angina, Valve Disease, and Cardiac Arrhythmias.  Written material given at graduation.   Medication Safety: - Group verbal and visual instruction to review commonly prescribed medications for heart and lung disease. Reviews the  medication, class of the drug, and side effects. Includes the steps to properly store meds and maintain the prescription regimen.  Written material given at graduation.   Other: -Provides group and verbal instruction on various topics (see comments)   Knowledge Questionnaire Score:  Knowledge Questionnaire Score - 12/20/22 1637       Knowledge Questionnaire Score   Pre Score 12/18              Core Components/Risk Factors/Patient Goals at Admission:  Personal Goals and Risk Factors at Admission - 12/20/22 1716       Core Components/Risk Factors/Patient Goals on Admission    Weight Management Yes;Weight Maintenance    Intervention Weight Management: Develop a combined nutrition and exercise program designed to reach desired caloric intake, while maintaining appropriate intake of nutrient and fiber, sodium and fats, and appropriate energy expenditure required for the weight goal.;Weight Management: Provide education and appropriate resources to help participant work on and attain dietary goals.;Weight Management/Obesity: Establish reasonable short term and long term weight goals.    Admit Weight 129 lb (58.5 kg)    Goal Weight: Short Term 129 lb (58.5 kg)    Goal Weight: Long Term 129 lb (58.5 kg)    Expected Outcomes Short Term: Continue to assess and modify interventions until short term weight is achieved;Long Term: Adherence to nutrition and physical activity/exercise program aimed toward attainment of established weight goal;Weight Maintenance: Understanding of the daily nutrition guidelines, which includes 25-35% calories from fat, 7% or less cal from saturated fats, less than 225m cholesterol, less than 1.5gm of sodium, & 5 or more servings of fruits and vegetables daily;Understanding recommendations for meals to include 15-35% energy as protein, 25-35% energy  from fat, 35-60% energy from carbohydrates, less than 241m of dietary cholesterol, 20-35 gm of total fiber  daily;Understanding of distribution of calorie intake throughout the day with the consumption of 4-5 meals/snacks    Hypertension Yes   kidney disease   Intervention Provide education on lifestyle modifcations including regular physical activity/exercise, weight management, moderate sodium restriction and increased consumption of fresh fruit, vegetables, and low fat dairy, alcohol moderation, and smoking cessation.;Monitor prescription use compliance.    Expected Outcomes Short Term: Continued assessment and intervention until BP is < 140/960mHG in hypertensive participants. < 130/8040mG in hypertensive participants with diabetes, heart failure or chronic kidney disease.;Long Term: Maintenance of blood pressure at goal levels.    Lipids Yes    Intervention Provide education and support for participant on nutrition & aerobic/resistive exercise along with prescribed medications to achieve LDL <63m96mDL >40mg36m Expected Outcomes Short Term: Participant states understanding of desired cholesterol values and is compliant with medications prescribed. Participant is following exercise prescription and nutrition guidelines.;Long Term: Cholesterol controlled with medications as prescribed, with individualized exercise RX and with personalized nutrition plan. Value goals: LDL < 63mg,35m > 40 mg.             Education:Diabetes - Individual verbal and written instruction to review signs/symptoms of diabetes, desired ranges of glucose level fasting, after meals and with exercise. Acknowledge that pre and post exercise glucose checks will be done for 3 sessions at entry of program.   Know Your Numbers and Heart Failure: - Group verbal and visual instruction to discuss disease risk factors for cardiac and pulmonary disease and treatment options.  Reviews associated critical values for Overweight/Obesity, Hypertension, Cholesterol, and Diabetes.  Discusses basics of heart failure: signs/symptoms and  treatments.  Introduces Heart Failure Zone chart for action plan for heart failure.  Written material given at graduation.   Core Components/Risk Factors/Patient Goals Review:    Core Components/Risk Factors/Patient Goals at Discharge (Final Review):    ITP Comments:  ITP Comments     Row Name 12/14/22 1520 12/20/22 1639 12/25/22 1014 12/26/22 2059 12/27/22 1308   ITP Comments Virtual orientation call completed today. shehas an appointment on Date: 12/20/2022  for EP eval and gym Orientation.  [facility] [id number] 6MWT and gym orientation. Initial ITP created and sent for review to Dr. Fuad AOttie Glaziercal Director. First full day of exercise!  Patient was oriented to gym and equipment including functions, settings, policies, and procedures.  Patient's individual exercise prescription and treatment plan were reviewed.  All starting workloads were established based on the results of the 6 minute walk test done at initial orientation visit.  The plan for exercise progression was also introduced and progression will be customized based on patient's performance and goals. Patient requested at gym orientation about the process of DNR. RN had thorough explanation with patient on process on how to proceed as DNR under rehab. Patient was told she would need to sign a new consent with rehab in order to correct her code. She was also given the number to call to change her status in her medical chart under [place]. Until the new consent is signed, patient agrees to exercise under our consent to treat. Patient expressed understanding and agreed to start exercise. 30 day review completed. ITP sent to Dr. Faud AZetta Billscal Director of  Pulmonary Rehab. Continue with ITP unless changes are made by physician. Pt new  to program, oriented on 12/20/22.            Comments: 30 day review

## 2022-12-28 ENCOUNTER — Encounter: Payer: HMO | Admitting: *Deleted

## 2022-12-28 DIAGNOSIS — J439 Emphysema, unspecified: Secondary | ICD-10-CM | POA: Diagnosis not present

## 2022-12-28 DIAGNOSIS — J479 Bronchiectasis, uncomplicated: Secondary | ICD-10-CM

## 2022-12-28 NOTE — Progress Notes (Signed)
Daily Session Note  Patient Details  Name: Kristen Bartlett MRN: [id number] Date of Birth: [birth year] Referring Provider:   Flowsheet Row Pulmonary Rehab from 12/20/2022 in [facility] Cardiac and Pulmonary Rehab  Referring Provider Kirke Shaggy MD       Encounter Date: 12/28/2022  Check In:  Session Check In - 12/28/22 0719       Check-In   Supervising physician immediately available to respond to emergencies See telemetry face sheet for immediately available ER MD    Location [facility]-Cardiac & Pulmonary Rehab    Staff Present Darlyne Russian, RN, ADN;Jessica Luan Pulling, MA, RCEP, CCRP, Bertram Gala, MS, ACSM CEP, Exercise Physiologist;Joseph Tessie Fass, [place]    Virtual Visit No    Medication changes reported     No    Fall or balance concerns reported    No    Warm-up and Cool-down Performed on first and last piece of equipment    Resistance Training Performed Yes    VAD Patient? No    PAD/SET Patient? No      Pain Assessment   Currently in Pain? No/denies                Social History   Tobacco Use  Smoking Status Former   Packs/day: 2.00   Years: 30.00   Total pack years: 60.00   Types: Cigarettes   Quit date: 12/09/1983   Years since quitting: 39.0  Smokeless Tobacco Never    Goals Met:  Independence with exercise equipment Exercise tolerated well No report of concerns or symptoms today Strength training completed today  Goals Unmet:  Not Applicable  Comments: Pt able to follow exercise prescription today without complaint.  Will continue to monitor for progression.    Dr. Emily Filbert is Medical Director for [place].  Dr. Ottie Glazier is Medical Director for [facility] Pulmonary Rehabilitation.

## 2023-01-01 ENCOUNTER — Encounter: Payer: HMO | Admitting: *Deleted

## 2023-01-01 DIAGNOSIS — J439 Emphysema, unspecified: Secondary | ICD-10-CM | POA: Diagnosis not present

## 2023-01-01 DIAGNOSIS — J479 Bronchiectasis, uncomplicated: Secondary | ICD-10-CM

## 2023-01-01 NOTE — Progress Notes (Signed)
Daily Session Note  Patient Details  Name: Kristen Bartlett MRN: [id number] Date of Birth: [birth year] Referring Provider:   Flowsheet Row Pulmonary Rehab from 12/20/2022 in [facility] Cardiac and Pulmonary Rehab  Referring Provider Kirke Shaggy MD       Encounter Date: 01/01/2023  Check In:  Session Check In - 01/01/23 0731       Check-In   Supervising physician immediately available to respond to emergencies See telemetry face sheet for immediately available ER MD    Location [facility]-Cardiac & Pulmonary Rehab    Staff Present Darlyne Russian, RN, ADN;Joseph Tessie Fass, RCP,RRT,BSRT;Kelly Amedeo Plenty, BS, ACSM CEP, Exercise Physiologist    Virtual Visit No    Medication changes reported     No    Fall or balance concerns reported    No    Warm-up and Cool-down Performed on first and last piece of equipment    Resistance Training Performed Yes    VAD Patient? No    PAD/SET Patient? No      Pain Assessment   Currently in Pain? No/denies                Social History   Tobacco Use  Smoking Status Former   Packs/day: 2.00   Years: 30.00   Total pack years: 60.00   Types: Cigarettes   Quit date: 12/09/1983   Years since quitting: 39.0  Smokeless Tobacco Never    Goals Met:  Independence with exercise equipment Exercise tolerated well No report of concerns or symptoms today Strength training completed today  Goals Unmet:  Not Applicable  Comments: Pt able to follow exercise prescription today without complaint.  Will continue to monitor for progression.    Dr. Emily Filbert is Medical Director for [place].  Dr. Ottie Glazier is Medical Director for [facility] Pulmonary Rehabilitation.

## 2023-01-03 ENCOUNTER — Encounter: Payer: HMO | Admitting: *Deleted

## 2023-01-03 DIAGNOSIS — J439 Emphysema, unspecified: Secondary | ICD-10-CM | POA: Diagnosis not present

## 2023-01-03 NOTE — Progress Notes (Signed)
Daily Session Note  Patient Details  Name: Kristen Bartlett MRN: [id number] Date of Birth: 1940/12/22 Referring Provider:   Flowsheet Row Pulmonary Rehab from 12/20/2022 in [facility] Cardiac and Pulmonary Rehab  Referring Provider Kirke Shaggy MD       Encounter Date: 01/03/2023  Check In:  Session Check In - 01/03/23 0732       Check-In   Supervising physician immediately available to respond to emergencies See telemetry face sheet for immediately available ER MD    Location [facility]-Cardiac & Pulmonary Rehab    Staff Present Darlyne Russian, RN, ADN;Joseph Tessie Fass, RCP,RRT,BSRT;Noah Tickle, BS, Exercise Physiologist;Jessica [place], MA, RCEP, CCRP, CCET    Virtual Visit No    Medication changes reported     No    Fall or balance concerns reported    No    Warm-up and Cool-down Performed on first and last piece of equipment    Resistance Training Performed Yes    VAD Patient? No    PAD/SET Patient? No      Pain Assessment   Currently in Pain? No/denies                Social History   Tobacco Use  Smoking Status Former   Packs/day: 2.00   Years: 30.00   Total pack years: 60.00   Types: Cigarettes   Quit date: 12/09/1983   Years since quitting: 39.0  Smokeless Tobacco Never    Goals Met:  Independence with exercise equipment Exercise tolerated well No report of concerns or symptoms today Strength training completed today  Goals Unmet:  Not Applicable  Comments: Pt able to follow exercise prescription today without complaint.  Will continue to monitor for progression.    Dr. Emily Filbert is Medical Director for [place].  Dr. Ottie Glazier is Medical Director for [facility] Pulmonary Rehabilitation.

## 2023-01-04 ENCOUNTER — Encounter: Payer: HMO | Admitting: *Deleted

## 2023-01-04 DIAGNOSIS — J439 Emphysema, unspecified: Secondary | ICD-10-CM

## 2023-01-04 DIAGNOSIS — J479 Bronchiectasis, uncomplicated: Secondary | ICD-10-CM

## 2023-01-04 NOTE — Progress Notes (Signed)
Daily Session Note  Patient Details  Name: Kristen Bartlett MRN: [id number] Date of Birth: 08/08/1941 Referring Provider:   Flowsheet Row Pulmonary Rehab from 12/20/2022 in [facility] Cardiac and Pulmonary Rehab  Referring Provider Kirke Shaggy MD       Encounter Date: 01/04/2023  Check In:  Session Check In - 01/04/23 0724       Check-In   Supervising physician immediately available to respond to emergencies See telemetry face sheet for immediately available ER MD    Location [facility]-Cardiac & Pulmonary Rehab    Staff Present Darlyne Russian, RN, ADN;Laureen Owens Shark, BS, RRT, CPFT;Joseph [place], Lorre Nick, MA, RCEP, CCRP, CCET    Virtual Visit No    Medication changes reported     No    Fall or balance concerns reported    No    Warm-up and Cool-down Performed on first and last piece of equipment    Resistance Training Performed Yes    VAD Patient? No    PAD/SET Patient? No      Pain Assessment   Currently in Pain? No/denies                Social History   Tobacco Use  Smoking Status Former   Packs/day: 2.00   Years: 30.00   Total pack years: 60.00   Types: Cigarettes   Quit date: 12/09/1983   Years since quitting: 39.0  Smokeless Tobacco Never    Goals Met:  Independence with exercise equipment Exercise tolerated well No report of concerns or symptoms today Strength training completed today  Goals Unmet:  Not Applicable  Comments: Pt able to follow exercise prescription today without complaint.  Will continue to monitor for progression.    Dr. Emily Filbert is Medical Director for [place].  Dr. Ottie Glazier is Medical Director for [facility] Pulmonary Rehabilitation.

## 2023-01-08 ENCOUNTER — Encounter: Payer: HMO | Attending: Internal Medicine | Admitting: *Deleted

## 2023-01-08 DIAGNOSIS — J439 Emphysema, unspecified: Secondary | ICD-10-CM | POA: Diagnosis present

## 2023-01-08 DIAGNOSIS — J479 Bronchiectasis, uncomplicated: Secondary | ICD-10-CM | POA: Insufficient documentation

## 2023-01-08 NOTE — Progress Notes (Signed)
Daily Session Note  Patient Details  Name: Kristen Bartlett MRN: [id number] Date of Birth: 1941-09-23 Referring Provider:   Flowsheet Row Pulmonary Rehab from 12/20/2022 in [facility] Cardiac and Pulmonary Rehab  Referring Provider Kirke Shaggy MD       Encounter Date: 01/08/2023  Check In:  Session Check In - 01/08/23 0720       Check-In   Supervising physician immediately available to respond to emergencies See telemetry face sheet for immediately available ER MD    Location [facility]-Cardiac & Pulmonary Rehab    Staff Present Darlyne Russian, RN, Doyce Para, BS, ACSM CEP, Exercise Physiologist;Joseph [place], Lorre Nick, MA, RCEP, CCRP, CCET    Virtual Visit No    Medication changes reported     No    Fall or balance concerns reported    No    Warm-up and Cool-down Performed on first and last piece of equipment    Resistance Training Performed Yes    VAD Patient? No    PAD/SET Patient? No      Pain Assessment   Currently in Pain? No/denies                Social History   Tobacco Use  Smoking Status Former   Packs/day: 2.00   Years: 30.00   Total pack years: 60.00   Types: Cigarettes   Quit date: 12/09/1983   Years since quitting: 39.1  Smokeless Tobacco Never    Goals Met:  Independence with exercise equipment Exercise tolerated well No report of concerns or symptoms today Strength training completed today  Goals Unmet:  Not Applicable  Comments: Pt able to follow exercise prescription today without complaint.  Will continue to monitor for progression.    Dr. Emily Filbert is Medical Director for [place].  Dr. Ottie Glazier is Medical Director for [facility] Pulmonary Rehabilitation.

## 2023-01-10 ENCOUNTER — Encounter: Payer: HMO | Admitting: *Deleted

## 2023-01-10 DIAGNOSIS — J439 Emphysema, unspecified: Secondary | ICD-10-CM

## 2023-01-10 DIAGNOSIS — J479 Bronchiectasis, uncomplicated: Secondary | ICD-10-CM

## 2023-01-10 NOTE — Progress Notes (Signed)
Daily Session Note  Patient Details  Name: Kristen Bartlett MRN: [id number] Date of Birth: October 20, 1941 Referring Provider:   Flowsheet Row Pulmonary Rehab from 12/20/2022 in [facility] Cardiac and Pulmonary Rehab  Referring Provider Kirke Shaggy MD       Encounter Date: 01/10/2023  Check In:  Session Check In - 01/10/23 0722       Check-In   Supervising physician immediately available to respond to emergencies See telemetry face sheet for immediately available ER MD    Location [facility]-Cardiac & Pulmonary Rehab    Staff Present Darlyne Russian, RN, ADN;Jessica Luan Pulling, MA, RCEP, CCRP, CCET;Noah Tickle, BS, Exercise Physiologist    Virtual Visit No    Medication changes reported     No    Fall or balance concerns reported    No    Warm-up and Cool-down Performed on first and last piece of equipment    Resistance Training Performed Yes    VAD Patient? No    PAD/SET Patient? No      Pain Assessment   Currently in Pain? No/denies                Social History   Tobacco Use  Smoking Status Former   Packs/day: 2.00   Years: 30.00   Total pack years: 60.00   Types: Cigarettes   Quit date: 12/09/1983   Years since quitting: 39.1  Smokeless Tobacco Never    Goals Met:  Independence with exercise equipment Exercise tolerated well No report of concerns or symptoms today Strength training completed today  Goals Unmet:  Not Applicable  Comments: Pt able to follow exercise prescription today without complaint.  Will continue to monitor for progression.    Dr. Emily Filbert is Medical Director for [place].  Dr. Ottie Glazier is Medical Director for [facility] Pulmonary Rehabilitation.

## 2023-01-11 ENCOUNTER — Encounter: Payer: HMO | Admitting: *Deleted

## 2023-01-11 DIAGNOSIS — J439 Emphysema, unspecified: Secondary | ICD-10-CM

## 2023-01-11 DIAGNOSIS — J479 Bronchiectasis, uncomplicated: Secondary | ICD-10-CM

## 2023-01-11 NOTE — Progress Notes (Signed)
Daily Session Note  Patient Details  Name: Kristen Bartlett MRN: [id number] Date of Birth: [birth year] Referring Provider:   Flowsheet Row Pulmonary Rehab from 12/20/2022 in [facility] Cardiac and Pulmonary Rehab  Referring Provider Kirke Shaggy MD       Encounter Date: 01/11/2023  Check In:  Session Check In - 01/11/23 0729       Check-In   Supervising physician immediately available to respond to emergencies See telemetry face sheet for immediately available ER MD    Location [facility]-Cardiac & Pulmonary Rehab    Staff Present Darlyne Russian, RN, ADN;Jessica Luan Pulling, MA, RCEP, CCRP, Bertram Gala, MS, ACSM CEP, Exercise Physiologist    Virtual Visit No    Medication changes reported     No    Fall or balance concerns reported    No    Warm-up and Cool-down Performed on first and last piece of equipment    Resistance Training Performed Yes    VAD Patient? No    PAD/SET Patient? No      Pain Assessment   Currently in Pain? No/denies                Social History   Tobacco Use  Smoking Status Former   Packs/day: 2.00   Years: 30.00   Total pack years: 60.00   Types: Cigarettes   Quit date: 12/09/1983   Years since quitting: 39.1  Smokeless Tobacco Never    Goals Met:  Independence with exercise equipment Exercise tolerated well No report of concerns or symptoms today Strength training completed today  Goals Unmet:  Not Applicable  Comments: Pt able to follow exercise prescription today without complaint.  Will continue to monitor for progression.    Dr. Emily Filbert is Medical Director for [place].  Dr. Ottie Glazier is Medical Director for [facility] Pulmonary Rehabilitation.

## 2023-01-11 NOTE — Progress Notes (Signed)
Completed initial RD consultation ?

## 2023-01-15 ENCOUNTER — Encounter: Payer: HMO | Admitting: *Deleted

## 2023-01-15 DIAGNOSIS — J439 Emphysema, unspecified: Secondary | ICD-10-CM | POA: Diagnosis not present

## 2023-01-15 DIAGNOSIS — J479 Bronchiectasis, uncomplicated: Secondary | ICD-10-CM

## 2023-01-15 NOTE — Progress Notes (Signed)
Daily Session Note  Patient Details  Name: Kristen Bartlett MRN: [id number] Date of Birth: 1941/06/14 Referring Provider:   Flowsheet Row Pulmonary Rehab from 12/20/2022 in [facility] Cardiac and Pulmonary Rehab  Referring Provider Kirke Shaggy MD       Encounter Date: 01/15/2023  Check In:  Session Check In - 01/15/23 0735       Check-In   Supervising physician immediately available to respond to emergencies See telemetry face sheet for immediately available ER MD    Location [facility]-Cardiac & Pulmonary Rehab    Staff Present Darlyne Russian, RN, Doyce Para, BS, ACSM CEP, Exercise Physiologist;Joseph Tessie Fass, [place]    Virtual Visit No    Medication changes reported     No    Fall or balance concerns reported    No    Warm-up and Cool-down Performed on first and last piece of equipment    Resistance Training Performed Yes    VAD Patient? No    PAD/SET Patient? No      Pain Assessment   Currently in Pain? No/denies                Social History   Tobacco Use  Smoking Status Former   Packs/day: 2.00   Years: 30.00   Total pack years: 60.00   Types: Cigarettes   Quit date: 12/09/1983   Years since quitting: 39.1  Smokeless Tobacco Never    Goals Met:  Independence with exercise equipment Exercise tolerated well No report of concerns or symptoms today Strength training completed today  Goals Unmet:  Not Applicable  Comments: Pt able to follow exercise prescription today without complaint.  Will continue to monitor for progression.    Dr. Emily Filbert is Medical Director for [place].  Dr. Ottie Glazier is Medical Director for [facility] Pulmonary Rehabilitation.

## 2023-01-17 ENCOUNTER — Encounter: Payer: HMO | Admitting: *Deleted

## 2023-01-17 DIAGNOSIS — J439 Emphysema, unspecified: Secondary | ICD-10-CM | POA: Diagnosis not present

## 2023-01-17 DIAGNOSIS — J479 Bronchiectasis, uncomplicated: Secondary | ICD-10-CM

## 2023-01-17 NOTE — Progress Notes (Signed)
Daily Session Note  Patient Details  Name: Kristen Bartlett MRN: [id number] Date of Birth: October 05, 1941 Referring Provider:   Flowsheet Row Pulmonary Rehab from 12/20/2022 in [facility] Cardiac and Pulmonary Rehab  Referring Provider Kirke Shaggy MD       Encounter Date: 01/17/2023  Check In:  Session Check In - 01/17/23 0738       Check-In   Supervising physician immediately available to respond to emergencies See telemetry face sheet for immediately available ER MD    Location [facility]-Cardiac & Pulmonary Rehab    Staff Present Darlyne Russian, RN, ADN;Joseph Tessie Fass, RCP,RRT,BSRT;Noah Tickle, BS, Exercise Physiologist;Jessica [place], MA, RCEP, CCRP, CCET    Virtual Visit No    Medication changes reported     No    Fall or balance concerns reported    No    Warm-up and Cool-down Performed on first and last piece of equipment    Resistance Training Performed Yes    VAD Patient? No    PAD/SET Patient? No      Pain Assessment   Currently in Pain? No/denies                Social History   Tobacco Use  Smoking Status Former   Packs/day: 2.00   Years: 30.00   Total pack years: 60.00   Types: Cigarettes   Quit date: 12/09/1983   Years since quitting: 39.1  Smokeless Tobacco Never    Goals Met:  Independence with exercise equipment Exercise tolerated well No report of concerns or symptoms today Strength training completed today  Goals Unmet:  Not Applicable  Comments: Pt able to follow exercise prescription today without complaint.  Will continue to monitor for progression.    Dr. Emily Filbert is Medical Director for [place].  Dr. Ottie Glazier is Medical Director for [facility] Pulmonary Rehabilitation.

## 2023-01-18 ENCOUNTER — Encounter: Payer: HMO | Admitting: *Deleted

## 2023-01-18 DIAGNOSIS — J439 Emphysema, unspecified: Secondary | ICD-10-CM

## 2023-01-18 DIAGNOSIS — J479 Bronchiectasis, uncomplicated: Secondary | ICD-10-CM

## 2023-01-18 NOTE — Progress Notes (Signed)
Daily Session Note  Patient Details  Name: Kristen Bartlett MRN: [id number] Date of Birth: 1941-05-10 Referring Provider:   Flowsheet Row Pulmonary Rehab from 12/20/2022 in [facility] Cardiac and Pulmonary Rehab  Referring Provider Kirke Shaggy MD       Encounter Date: 01/18/2023  Check In:  Session Check In - 01/18/23 0722       Check-In   Supervising physician immediately available to respond to emergencies See telemetry face sheet for immediately available ER MD    Location [facility]-Cardiac & Pulmonary Rehab    Staff Present Darlyne Russian, RN, ADN;Joseph Tessie Fass, Darla Lesches, MS, ACSM CEP, Exercise Physiologist;Jessica Luan [place], MA, RCEP, CCRP, CCET    Virtual Visit No    Medication changes reported     No    Fall or balance concerns reported    No    Warm-up and Cool-down Performed on first and last piece of equipment    Resistance Training Performed Yes    VAD Patient? No    PAD/SET Patient? No      Pain Assessment   Currently in Pain? No/denies                Social History   Tobacco Use  Smoking Status Former   Packs/day: 2.00   Years: 30.00   Additional pack years: 0.00   Total pack years: 60.00   Types: Cigarettes   Quit date: 12/09/1983   Years since quitting: 39.1  Smokeless Tobacco Never    Goals Met:  Independence with exercise equipment Exercise tolerated well No report of concerns or symptoms today Strength training completed today  Goals Unmet:  Not Applicable  Comments: Pt able to follow exercise prescription today without complaint.  Will continue to monitor for progression.    Dr. Emily Filbert is Medical Director for [place].  Dr. Ottie Glazier is Medical Director for [facility] Pulmonary Rehabilitation.

## 2023-01-22 ENCOUNTER — Encounter: Payer: HMO | Admitting: *Deleted

## 2023-01-22 DIAGNOSIS — J439 Emphysema, unspecified: Secondary | ICD-10-CM | POA: Diagnosis not present

## 2023-01-22 DIAGNOSIS — J479 Bronchiectasis, uncomplicated: Secondary | ICD-10-CM

## 2023-01-22 NOTE — Progress Notes (Signed)
Daily Session Note  Patient Details  Name: Veleta Guck MRN: [id number] Date of Birth: [birth year] Referring Provider:   Flowsheet Row Pulmonary Rehab from 12/20/2022 in [facility] Cardiac and Pulmonary Rehab  Referring Provider Kirke Shaggy MD       Encounter Date: 01/22/2023  Check In:  Session Check In - 01/22/23 0746       Check-In   Supervising physician immediately available to respond to emergencies See telemetry face sheet for immediately available ER MD    Location [facility]-Cardiac & Pulmonary Rehab    Staff Present Darlyne Russian, RN, Doyce Para, BS, ACSM CEP, Exercise Physiologist;Joseph Tessie Fass, [place]    Virtual Visit No    Medication changes reported     No    Fall or balance concerns reported    No    Warm-up and Cool-down Performed on first and last piece of equipment    Resistance Training Performed Yes    VAD Patient? No    PAD/SET Patient? No      Pain Assessment   Currently in Pain? No/denies                Social History   Tobacco Use  Smoking Status Former   Packs/day: 2.00   Years: 30.00   Additional pack years: 0.00   Total pack years: 60.00   Types: Cigarettes   Quit date: 12/09/1983   Years since quitting: 39.1  Smokeless Tobacco Never    Goals Met:  Independence with exercise equipment Exercise tolerated well No report of concerns or symptoms today Strength training completed today  Goals Unmet:  Not Applicable  Comments: Pt able to follow exercise prescription today without complaint.  Will continue to monitor for progression.    Dr. Emily Filbert is Medical Director for [place].  Dr. Ottie Glazier is Medical Director for [facility] Pulmonary Rehabilitation.

## 2023-01-24 ENCOUNTER — Encounter: Payer: HMO | Admitting: *Deleted

## 2023-01-24 ENCOUNTER — Encounter: Payer: Self-pay | Admitting: *Deleted

## 2023-01-24 DIAGNOSIS — J439 Emphysema, unspecified: Secondary | ICD-10-CM | POA: Diagnosis not present

## 2023-01-24 DIAGNOSIS — J479 Bronchiectasis, uncomplicated: Secondary | ICD-10-CM

## 2023-01-24 NOTE — Progress Notes (Signed)
Daily Session Note  Patient Details  Name: Shakiela Minkoff MRN: [id number] Date of Birth: 1941-06-17 Referring Provider:   Flowsheet Row Pulmonary Rehab from 12/20/2022 in [facility] Cardiac and Pulmonary Rehab  Referring Provider Kirke Shaggy MD       Encounter Date: 01/24/2023  Check In:  Session Check In - 01/24/23 0729       Check-In   Supervising physician immediately available to respond to emergencies See telemetry face sheet for immediately available ER MD    Location [facility]-Cardiac & Pulmonary Rehab    Staff Present Darlyne Russian, RN, ADN;Joseph Tessie Fass, RCP,RRT,BSRT;Noah Tickle, BS, Exercise Physiologist    Virtual Visit No    Medication changes reported     No    Fall or balance concerns reported    No    Warm-up and Cool-down Performed on first and last piece of equipment    Resistance Training Performed Yes    VAD Patient? No    PAD/SET Patient? No      Pain Assessment   Currently in Pain? No/denies                Social History   Tobacco Use  Smoking Status Former   Packs/day: 2.00   Years: 30.00   Additional pack years: 0.00   Total pack years: 60.00   Types: Cigarettes   Quit date: 12/09/1983   Years since quitting: 39.1  Smokeless Tobacco Never    Goals Met:  Independence with exercise equipment Exercise tolerated well No report of concerns or symptoms today Strength training completed today  Goals Unmet:  Not Applicable  Comments: Pt able to follow exercise prescription today without complaint.  Will continue to monitor for progression.    Dr. Emily Filbert is Medical Director for [place].  Dr. Ottie Glazier is Medical Director for [facility] Pulmonary Rehabilitation.

## 2023-01-24 NOTE — Progress Notes (Signed)
Pulmonary Individual Treatment Plan  Patient Details  Name: Kristen Bartlett MRN: [id number] Date of Birth: 02/12/1941 Referring Provider:   Flowsheet Row Pulmonary Rehab from 12/20/2022 in [facility] Cardiac and Pulmonary Rehab  Referring Provider Mina Marble, Tai MD       Initial Encounter Date:  Flowsheet Row Pulmonary Rehab from 12/20/2022 in [facility] Cardiac and Pulmonary Rehab  Date 12/20/22       Visit Diagnosis: Pulmonary emphysema, unspecified emphysema type ([place])  Bronchiectasis without complication ([place])  Patient's Home Medications on Admission:  Current Outpatient Medications:    acetaminophen (TYLENOL) 500 MG tablet, Take 1,500 mg by mouth every 6 (six) hours as needed for moderate pain., Disp: , Rfl:    acyclovir (ZOVIRAX) 200 MG capsule, Take 1 capsule (200 mg total) by mouth daily. (Patient not taking: Reported on 12/14/2022), Disp: 90 capsule, Rfl: 1   amLODipine (NORVASC) 10 MG tablet, Take 10 mg by mouth daily., Disp: , Rfl:    aspirin 81 MG tablet, Take 81 mg by mouth daily. (Patient not taking: Reported on 12/14/2022), Disp: , Rfl:    Calcium Carb-Cholecalciferol (CALCIUM + D3 PO), Take 600 mg by mouth 2 (two) times daily. (Patient not taking: Reported on 12/14/2022), Disp: , Rfl:    ezetimibe (ZETIA) 10 MG tablet, Take 1 tablet by mouth daily., Disp: , Rfl:    famotidine (PEPCID) 20 MG tablet, Take 20 mg by mouth 2 (two) times daily., Disp: , Rfl:    furosemide (LASIX) 20 MG tablet, Take 20 mg by mouth daily as needed., Disp: , Rfl:    levothyroxine (SYNTHROID, LEVOTHROID) 50 MCG tablet, Take 1 tablet (50 mcg total) by mouth daily., Disp: 90 tablet, Rfl: 1   losartan (COZAAR) 50 MG tablet, Take 50 mg by mouth daily., Disp: , Rfl:    Multiple Vitamin (MULTIVITAMIN) tablet, Take 1 tablet by mouth daily. (Patient not taking: Reported on 12/14/2022), Disp: , Rfl:    pantoprazole (PROTONIX) 40 MG tablet, Take 40 mg by mouth daily. (Patient not taking: Reported on 12/14/2022), Disp: , Rfl:     ranitidine (ZANTAC) 150 MG capsule, Take 150 mg by mouth daily. (Patient not taking: Reported on 12/14/2022), Disp: , Rfl:   Past Medical History: Past Medical History:  Diagnosis Date   Arthritis    GERD (gastroesophageal reflux disease)    Heart murmur    Hypercholesterolemia    Hypertension    Hypothyroidism    Syncope and collapse    Thin basement membrane disease    Thin basement membrane disease    Thin basement membrane disease     Tobacco Use: Social History   Tobacco Use  Smoking Status Former   Packs/day: 2.00   Years: 30.00   Additional pack years: 0.00   Total pack years: 60.00   Types: Cigarettes   Quit date: 12/09/1983   Years since quitting: 39.1  Smokeless Tobacco Never    Labs: Review Flowsheet       Latest Ref Rng & Units 07/28/2014  Labs for ITP Cardiac and Pulmonary Rehab  Cholestrol 0 - 200 mg/dL 211      LDL (calc) mg/dL 121      HDL-C 35 - 70 mg/dL 75      Trlycerides 40 - 160 mg/dL 76        Details       This result is from an external source.          Pulmonary Assessment Scores:  Pulmonary Assessment Scores  [place] Name 12/20/22 1640         ADL UCSD   ADL Phase Entry     SOB Score total 17     Rest 0     Walk 0     Stairs 3     Bath 0     Dress 0     Shop 1       CAT Score   CAT Score 18       mMRC Score   mMRC Score 1              UCSD: Self-administered rating of dyspnea associated with activities of daily living (ADLs) 6-point scale (0 = "not at all" to 5 = "maximal or unable to do because of breathlessness")  Scoring Scores range from 0 to 120.  Minimally important difference is 5 units  CAT: CAT can identify the health impairment of COPD patients and is better correlated with disease progression.  CAT has a scoring range of zero to 40. The CAT score is classified into four groups of low (less than 10), medium (10 - 20), high (21-30) and very high (31-40) based on the impact level of disease on health  status. A CAT score over 10 suggests significant symptoms.  A worsening CAT score could be explained by an exacerbation, poor medication adherence, poor inhaler technique, or progression of COPD or comorbid conditions.  CAT MCID is 2 points  mMRC: mMRC (Modified Medical Research Council) Dyspnea Scale is used to assess the degree of baseline functional disability in patients of respiratory disease due to dyspnea. No minimal important difference is established. A decrease in score of 1 point or greater is considered a positive change.   Pulmonary Function Assessment:   Exercise Target Goals: Exercise Program Goal: Individual exercise prescription set using results from initial 6 min walk test and THRR while considering  patient's activity barriers and safety.   Exercise Prescription Goal: Initial exercise prescription builds to 30-45 minutes a day of aerobic activity, 2-3 days per week.  Home exercise guidelines will be given to patient during program as part of exercise prescription that the participant will acknowledge.  Education: Aerobic Exercise: - Group verbal and visual presentation on the components of exercise prescription. Introduces F.I.T.T principle from ACSM for exercise prescriptions.  Reviews F.I.T.T. principles of aerobic exercise including progression. Written material given at graduation. Flowsheet Row Pulmonary Rehab from 01/18/2023 in [facility] Cardiac and Pulmonary Rehab  Education need identified 12/20/22  Date 01/11/23  Educator [facility]  Instruction Review Code 1- Verbalizes Understanding       Education: Resistance Exercise: - Group verbal and visual presentation on the components of exercise prescription. Introduces F.I.T.T principle from ACSM for exercise prescriptions  Reviews F.I.T.T. principles of resistance exercise including progression. Written material given at graduation.    Education: Exercise & Equipment Safety: - Individual verbal instruction and  demonstration of equipment use and safety with use of the equipment. Flowsheet Row Pulmonary Rehab from 01/18/2023 in [facility] Cardiac and Pulmonary Rehab  Education need identified 12/20/22  Date 12/20/22  Educator K[facility]  Instruction Review Code 1- Verbalizes Understanding       Education: Exercise Physiology & General Exercise Guidelines: - Group verbal and written instruction with models to review the exercise physiology of the cardiovascular system and associated critical values. Provides general exercise guidelines with specific guidelines to those with heart or lung disease.  Flowsheet Row Pulmonary Rehab from 01/18/2023 in [facility] Cardiac and Pulmonary Rehab  Education  need identified 12/20/22       Education: Flexibility, Balance, Mind/Body Relaxation: - Group verbal and visual presentation with interactive activity on the components of exercise prescription. Introduces F.I.T.T principle from ACSM for exercise prescriptions. Reviews F.I.T.T. principles of flexibility and balance exercise training including progression. Also discusses the mind body connection.  Reviews various relaxation techniques to help reduce and manage stress (i.e. Deep breathing, progressive muscle relaxation, and visualization). Balance handout provided to take home. Written material given at graduation.   Activity Barriers & Risk Stratification:  Activity Barriers & Cardiac Risk Stratification - 12/20/22 1647       Activity Barriers & Cardiac Risk Stratification   Activity Barriers Arthritis;Other (comment);Deconditioning;Muscular Weakness    Comments Scoliosis and arthiritis of spine             6 Minute Walk:  6 Minute Walk     Row Name 12/20/22 1649         6 Minute Walk   Phase Initial     Distance 1130 feet     Walk Time 6 minutes     # of Rest Breaks 0     MPH 2.14     METS 1.83     RPE 13     Perceived Dyspnea  1     VO2 Peak 6.41     Symptoms Yes (comment)     Comments Fatigue      Resting HR 71 bpm     Resting BP 110/62     Resting Oxygen Saturation  98 %     Exercise Oxygen Saturation  during 6 min walk 97 %     Max Ex. HR 93 bpm     Max Ex. BP 124/62     2 Minute Post BP 112/62       Interval HR   1 Minute HR 84     2 Minute HR 90     3 Minute HR 93     4 Minute HR 91     5 Minute HR 92     6 Minute HR 89     2 Minute Post HR 69     Interval Heart Rate? Yes       Interval Oxygen   Interval Oxygen? Yes     Baseline Oxygen Saturation % 98 %     1 Minute Oxygen Saturation % 98 %     1 Minute Liters of Oxygen 0 L  RA     2 Minute Oxygen Saturation % 97 %     2 Minute Liters of Oxygen 0 L     3 Minute Oxygen Saturation % 98 %     3 Minute Liters of Oxygen 0 L     4 Minute Oxygen Saturation % 99 %     4 Minute Liters of Oxygen 0 L     5 Minute Oxygen Saturation % 98 %     5 Minute Liters of Oxygen 0 L     6 Minute Oxygen Saturation % 99 %     6 Minute Liters of Oxygen 0 L     2 Minute Post Oxygen Saturation % 98 %     2 Minute Post Liters of Oxygen 0 L             Oxygen Initial Assessment:  Oxygen Initial Assessment - 12/20/22 1640       Home Oxygen   Home Oxygen Device None    Sleep Oxygen  Prescription None    Home Exercise Oxygen Prescription None    Home Resting Oxygen Prescription None      Initial 6 min Walk   Oxygen Used None      Program Oxygen Prescription   Program Oxygen Prescription None      Intervention   Short Term Goals To learn and understand importance of monitoring SPO2 with pulse oximeter and demonstrate accurate use of the pulse oximeter.;To learn and understand importance of maintaining oxygen saturations>88%;To learn and demonstrate proper pursed lip breathing techniques or other breathing techniques.     Long  Term Goals Maintenance of O2 saturations>88%;Exhibits proper breathing techniques, such as pursed lip breathing or other method taught during program session             Oxygen Re-Evaluation:  Oxygen  Re-Evaluation     Row Name 12/25/22 1015 01/11/23 0731           Program Oxygen Prescription   Program Oxygen Prescription -- None        Home Oxygen   Home Oxygen Device -- None      Sleep Oxygen Prescription -- None      Home Exercise Oxygen Prescription -- None      Home Resting Oxygen Prescription -- None        Goals/Expected Outcomes   Short Term Goals -- To learn and understand importance of monitoring SPO2 with pulse oximeter and demonstrate accurate use of the pulse oximeter.;To learn and understand importance of maintaining oxygen saturations>88%;To learn and demonstrate proper pursed lip breathing techniques or other breathing techniques.       Long  Term Goals -- Maintenance of O2 saturations>88%;Exhibits proper breathing techniques, such as pursed lip breathing or other method taught during program session;Verbalizes importance of monitoring SPO2 with pulse oximeter and return demonstration      Comments Reviewed PLB technique with pt.  Talked about how it works and it's importance in maintaining their exercise saturations. Sonni is doing well in rehab.  She is not having the best day today feeling wise.  She is using her PLB routinely.  She has a pulse oximeter and will bring it in to test again what we are getting in class.      Goals/Expected Outcomes Short: Become more profiecient at using PLB.   Long: Become independent at using PLB. Short: Bring in pulse oximeter Long: Continue to use PLB               Oxygen Discharge (Final Oxygen Re-Evaluation):  Oxygen Re-Evaluation - 01/11/23 0731       Program Oxygen Prescription   Program Oxygen Prescription None      Home Oxygen   Home Oxygen Device None    Sleep Oxygen Prescription None    Home Exercise Oxygen Prescription None    Home Resting Oxygen Prescription None      Goals/Expected Outcomes   Short Term Goals To learn and understand importance of monitoring SPO2 with pulse oximeter and demonstrate accurate  use of the pulse oximeter.;To learn and understand importance of maintaining oxygen saturations>88%;To learn and demonstrate proper pursed lip breathing techniques or other breathing techniques.     Long  Term Goals Maintenance of O2 saturations>88%;Exhibits proper breathing techniques, such as pursed lip breathing or other method taught during program session;Verbalizes importance of monitoring SPO2 with pulse oximeter and return demonstration    Comments Nevelyn is doing well in rehab.  She is not having the best day today feeling wise.  She is using her PLB routinely.  She has a pulse oximeter and will bring it in to test again what we are getting in class.    Goals/Expected Outcomes Short: Bring in pulse oximeter Long: Continue to use PLB             Initial Exercise Prescription:  Initial Exercise Prescription - 12/20/22 1700       Date of Initial Exercise RX and Referring Provider   Date 12/20/22    Referring Provider Mina Marble, Tai MD      Oxygen   Maintain Oxygen Saturation 88% or higher      Treadmill   MPH 1.6    Grade 0    Minutes 15    METs 2.23      NuStep   Level 1    SPM 80    Minutes 15    METs 1.8      Biostep-RELP   Level 1    SPM 50    Minutes 15    METs 1.8      Track   Laps 20    Minutes 15    METs 2.09      Prescription Details   Frequency (times per week) 3    Duration Progress to 30 minutes of continuous aerobic without signs/symptoms of physical distress      Intensity   THRR 40-80% of Max Heartrate 97 - 123    Ratings of Perceived Exertion 11-13    Perceived Dyspnea 0-4      Progression   Progression Continue to progress workloads to maintain intensity without signs/symptoms of physical distress.      Resistance Training   Training Prescription Yes    Weight 2 lb    Reps 10-15             Perform Capillary Blood Glucose checks as needed.  Exercise Prescription Changes:   Exercise Prescription Changes     Row Name 12/20/22  1700 01/02/23 1500 01/18/23 1400         Response to Exercise   Blood Pressure (Admit) 110/62 124/68 112/62     Blood Pressure (Exercise) 124/62 132/62 138/66     Blood Pressure (Exit) 112/62 104/62 108/60     Heart Rate (Admit) 71 bpm 68 bpm 73 bpm     Heart Rate (Exercise) 93 bpm 97 bpm 95 bpm     Heart Rate (Exit) 76 bpm 73 bpm 79 bpm     Oxygen Saturation (Admit) 98 % 96 % 97 %     Oxygen Saturation (Exercise) 97 % 90 % 94 %     Oxygen Saturation (Exit) 98 % 98 % 98 %     Rating of Perceived Exertion (Exercise) 13 14 15      Perceived Dyspnea (Exercise) 1 2 2      Symptoms Fatigue fatigue none     Comments walk test results 3rd full day of exercise --     Duration -- Progress to 30 minutes of  aerobic without signs/symptoms of physical distress Progress to 30 minutes of  aerobic without signs/symptoms of physical distress     Intensity -- THRR unchanged THRR unchanged       Progression   Progression -- Continue to progress workloads to maintain intensity without signs/symptoms of physical distress. Continue to progress workloads to maintain intensity without signs/symptoms of physical distress.     Average METs -- 2.35 2.08       Resistance Training   Training Prescription --  Yes Yes     Weight -- 2 lb 2 lb     Reps -- 10-15 10-15       Interval Training   Interval Training -- No No       Treadmill   MPH -- 1.6 1.6     Grade -- 0 0     Minutes -- 15 15     METs -- 2.23 2.23       Recumbant Bike   Level -- 1 --     Minutes -- 15 --     METs -- 3.34 --       NuStep   Level -- 1 1     Minutes -- 15 15     METs -- 2 2       T5 Nustep   Level -- -- 1     Minutes -- -- 15     METs -- -- 1.7       Biostep-RELP   Level -- -- 1     Minutes -- -- 15     METs -- -- 2       Oxygen   Maintain Oxygen Saturation -- 88% or higher 88% or higher              Exercise Comments:   Exercise Comments     Row Name 12/25/22 1015           Exercise Comments First  full day of exercise!  Patient was oriented to gym and equipment including functions, settings, policies, and procedures.  Patient's individual exercise prescription and treatment plan were reviewed.  All starting workloads were established based on the results of the 6 minute walk test done at initial orientation visit.  The plan for exercise progression was also introduced and progression will be customized based on patient's performance and goals.                Exercise Goals and Review:   Exercise Goals     Row Name 12/20/22 1716             Exercise Goals   Increase Physical Activity Yes       Intervention Provide advice, education, support and counseling about physical activity/exercise needs.;Develop an individualized exercise prescription for aerobic and resistive training based on initial evaluation findings, risk stratification, comorbidities and participant's personal goals.       Expected Outcomes Short Term: Attend rehab on a regular basis to increase amount of physical activity.;Long Term: Add in home exercise to make exercise part of routine and to increase amount of physical activity.;Long Term: Exercising regularly at least 3-5 days a week.       Increase Strength and Stamina Yes       Intervention Provide advice, education, support and counseling about physical activity/exercise needs.;Develop an individualized exercise prescription for aerobic and resistive training based on initial evaluation findings, risk stratification, comorbidities and participant's personal goals.       Expected Outcomes Short Term: Increase workloads from initial exercise prescription for resistance, speed, and METs.;Short Term: Perform resistance training exercises routinely during rehab and add in resistance training at home;Long Term: Improve cardiorespiratory fitness, muscular endurance and strength as measured by increased METs and functional capacity (6MWT)       Able to understand and use  rate of perceived exertion (RPE) scale Yes       Intervention Provide education and explanation on how to use RPE scale  Expected Outcomes Short Term: Able to use RPE daily in rehab to express subjective intensity level;Long Term:  Able to use RPE to guide intensity level when exercising independently       Able to understand and use Dyspnea scale Yes       Intervention Provide education and explanation on how to use Dyspnea scale       Expected Outcomes Short Term: Able to use Dyspnea scale daily in rehab to express subjective sense of shortness of breath during exertion;Long Term: Able to use Dyspnea scale to guide intensity level when exercising independently       Knowledge and understanding of Target Heart Rate Range (THRR) Yes       Intervention Provide education and explanation of THRR including how the numbers were predicted and where they are located for reference       Expected Outcomes Short Term: Able to state/look up THRR;Long Term: Able to use THRR to govern intensity when exercising independently;Short Term: Able to use daily as guideline for intensity in rehab       Able to check pulse independently Yes       Intervention Provide education and demonstration on how to check pulse in carotid and radial arteries.;Review the importance of being able to check your own pulse for safety during independent exercise       Expected Outcomes Long Term: Able to check pulse independently and accurately;Short Term: Able to explain why pulse checking is important during independent exercise       Understanding of Exercise Prescription Yes       Intervention Provide education, explanation, and written materials on patient's individual exercise prescription       Expected Outcomes Short Term: Able to explain program exercise prescription;Long Term: Able to explain home exercise prescription to exercise independently                Exercise Goals Re-Evaluation :  Exercise Goals  Re-Evaluation     Row Name 12/25/22 1015 01/02/23 1519 01/04/23 0744 01/18/23 1432       Exercise Goal Re-Evaluation   Exercise Goals Review Able to understand and use rate of perceived exertion (RPE) scale;Increase Physical Activity;Knowledge and understanding of Target Heart Rate Range (THRR);Understanding of Exercise Prescription;Increase Strength and Stamina;Able to understand and use Dyspnea scale;Able to check pulse independently Increase Physical Activity;Increase Strength and Stamina;Understanding of Exercise Prescription Increase Physical Activity;Increase Strength and Stamina;Understanding of Exercise Prescription Increase Physical Activity;Increase Strength and Stamina;Understanding of Exercise Prescription    Comments Reviewed RPE scale, THR and program prescription with pt today.  Pt voiced understanding and was given a copy of goals to take home. Amyla is doing well her first couple of weeks being here. She started easing into her initial exercise prescription and is now able to complete the exercises. She has been reporting appropriate RPEs and oxygen has stayed above 88%. We will continue to monitor as she progresses in the program. Halyn has been doing well for the first couple of weeks at rehab.Staff will talk about home exercise next time as patient just started the program. She is not doing anything at home yet. We discussed the importance of it and to start thinking of a plan she will attempt to pursue for home exercise. She is very interested in the [place] because of the [employer]. Brochure was provided to patient. Anneisha is doing well in the program. She has continued to work at level 1 on the biostep and T4 nustep. We  will encourage her to begin to progressively increase her workloads as she has not improved the levels on her seated machines yet. She also has consistently walked the treadmill at 1.6 mph with no incline, but would benefit from beginning to increase her walking  workload as well. We will cotninue to monitor her progress.    Expected Outcomes Short: Use RPE daily to regulate intensity.  Long: Follow program prescription in THR. Short: Continue to follow initial exercise prescription Long: Build up overall strength and stamina Short: Patient to look into [employer], staff to review home exercise Long: Exercise independently at home at appropriate prescription Short: Begin to increase workloads on treadmill and seated machines. Long: Continue to improve strength and stamina.             Discharge Exercise Prescription (Final Exercise Prescription Changes):  Exercise Prescription Changes - 01/18/23 1400       Response to Exercise   Blood Pressure (Admit) 112/62    Blood Pressure (Exercise) 138/66    Blood Pressure (Exit) 108/60    Heart Rate (Admit) 73 bpm    Heart Rate (Exercise) 95 bpm    Heart Rate (Exit) 79 bpm    Oxygen Saturation (Admit) 97 %    Oxygen Saturation (Exercise) 94 %    Oxygen Saturation (Exit) 98 %    Rating of Perceived Exertion (Exercise) 15    Perceived Dyspnea (Exercise) 2    Symptoms none    Duration Progress to 30 minutes of  aerobic without signs/symptoms of physical distress    Intensity THRR unchanged      Progression   Progression Continue to progress workloads to maintain intensity without signs/symptoms of physical distress.    Average METs 2.08      Resistance Training   Training Prescription Yes    Weight 2 lb    Reps 10-15      Interval Training   Interval Training No      Treadmill   MPH 1.6    Grade 0    Minutes 15    METs 2.23      NuStep   Level 1    Minutes 15    METs 2      T5 Nustep   Level 1    Minutes 15    METs 1.7      Biostep-RELP   Level 1    Minutes 15    METs 2      Oxygen   Maintain Oxygen Saturation 88% or higher             Nutrition:  Target Goals: Understanding of nutrition guidelines, daily intake of sodium 1500mg , cholesterol 200mg , calories 30% from  fat and 7% or less from saturated fats, daily to have 5 or more servings of fruits and vegetables.  Education: All About Nutrition: -Group instruction provided by verbal, written material, interactive activities, discussions, models, and posters to present general guidelines for heart healthy nutrition including fat, fiber, MyPlate, the role of sodium in heart healthy nutrition, utilization of the nutrition label, and utilization of this knowledge for meal planning. Follow up email sent as well. Written material given at graduation.   Biometrics:  Pre Biometrics - 12/20/22 1649       Pre Biometrics   Height 5' 1.5" (1.562 m)    Weight 129 lb 12.8 oz (58.9 kg)    Waist Circumference 33 inches    Hip Circumference 40.5 inches    Waist to Hip Ratio 0.81 %  BMI (Calculated) 24.13    Single Leg Stand 5.7 seconds              Nutrition Therapy Plan and Nutrition Goals:  Nutrition Therapy & Goals - 01/11/23 1151       Nutrition Therapy   Diet Heart healthy, low Na    Protein (specify units) 60g   Pt reports this is per her nephrologist.   Fiber 20 grams    Whole Grain Foods 3 servings    Saturated Fats 12 max. grams    Fruits and Vegetables 8 servings/day    Sodium 1.5 grams      Personal Nutrition Goals   Nutrition Goal ST: consider loosening restrictions with diet to include more heart healthy fats, fruits, and whole grains in addition to lean protein and non-starchy vegetables. Reduce protein by cutting portion from 6oz to 3 oz with meals.  LT: meet calorie and protein needs, continue with heart healthy habits already established.    Comments 82 y.o. F admitted to Pulmonary Rehab for pulmonary emphysema and bronchiectasis without complication. PMHx includes anemia, arthritis, CKD stg 3b, depression, GERD, HTN, osteoarthritis, former tobacco use. Reviewed relevant medications vit D3, synthroid, omeprazole. Reviewed most recent lab results. Abigael reports using GOLO diet to lose  weight over the last 2 months - this includes a supplement she reports that she made sure her nephrologist was ok with before proceeding. Food recall: She eats eggs in the morning and for Lunch and dinner she is eating 6oz of protein for each meal like chicken and fish with non-starchy vegetables and a slice of whole grain bread with either olive oil or some butter. She also enjoys bean soup for lunch. Sonna reports she would like to come off the diet to maintain weight, but is afraid she will gain weight. Discussed how the weight lost was likely due to low calorie diet, also discussed the amount of protein in 3oz of chicken or fish and her protein goal that she stated was from her nephrologist. Discussed updated CKD guidelines and importance of moderating protein, Ayesha reports being unaware of how much protein she was eating and would like to cut it back down. Discussed the importance of meeting protein and calorie needs as well as general heart healthy eating, pulmonary MNT, and MyPlate.      Intervention Plan   Intervention Prescribe, educate and counsel regarding individualized specific dietary modifications aiming towards targeted core components such as weight, hypertension, lipid management, diabetes, heart failure and other comorbidities.    Expected Outcomes Short Term Goal: Understand basic principles of dietary content, such as calories, fat, sodium, cholesterol and nutrients.;Short Term Goal: A plan has been developed with personal nutrition goals set during dietitian appointment.;Long Term Goal: Adherence to prescribed nutrition plan.             Nutrition Assessments:  MEDIFICTS Score Key: ?70 Need to make dietary changes  40-70 Heart Healthy Diet ? 40 Therapeutic Level Cholesterol Diet  Flowsheet Row Pulmonary Rehab from 12/20/2022 in [facility] Cardiac and Pulmonary Rehab  Picture Your Plate Total Score on Admission 74      Picture Your Plate Scores: [id number] Unhealthy dietary pattern  with much room for improvement. 41-50 Dietary pattern unlikely to meet recommendations for good health and room for improvement. 51-60 More healthful dietary pattern, with some room for improvement.  >60 Healthy dietary pattern, although there may be some specific behaviors that could be improved.   Nutrition Goals Re-Evaluation:  Nutrition Goals Re-Evaluation  [place] Name 01/04/23 [phone]             Goals   Nutrition Goal Patient changed her mind, originally she deferred seeing the dietician. Upon speaking with her today, patient has decided she would like to pursue a RD appointment. Rd appointment scheduled with RD for 3/7.                Nutrition Goals Discharge (Final Nutrition Goals Re-Evaluation):  Nutrition Goals Re-Evaluation - 01/04/23 0841       Goals   Nutrition Goal Patient changed her mind, originally she deferred seeing the dietician. Upon speaking with her today, patient has decided she would like to pursue a RD appointment. Rd appointment scheduled with RD for 3/7.             Psychosocial: Target Goals: Acknowledge presence or absence of significant depression and/or stress, maximize coping skills, provide positive support system. Participant is able to verbalize types and ability to use techniques and skills needed for reducing stress and depression.   Education: Stress, Anxiety, and Depression - Group verbal and visual presentation to define topics covered.  Reviews how body is impacted by stress, anxiety, and depression.  Also discusses healthy ways to reduce stress and to treat/manage anxiety and depression.  Written material given at graduation. Flowsheet Row Pulmonary Rehab from 01/18/2023 in [facility] Cardiac and Pulmonary Rehab  Date 01/18/23  Educator [facility]  Instruction Review Code 1- [employer] Understanding       Education: Sleep Hygiene -Provides group verbal and written instruction about how sleep can affect your health.  Define sleep hygiene,  discuss sleep cycles and impact of sleep habits. Review good sleep hygiene tips.    Initial Review & Psychosocial Screening:  Initial Psych Review & Screening - 12/14/22 1505       Initial Review   Current issues with None Identified      Family Dynamics   Good Support System? Yes   2 close friends,  boss at work     Barriers   Psychosocial barriers to participate in program There are no identifiable barriers or psychosocial needs.      Screening Interventions   Interventions Encouraged to exercise;To provide support and resources with identified psychosocial needs;Provide feedback about the scores to participant    Expected Outcomes Short Term goal: Utilizing psychosocial counselor, staff and physician to assist with identification of specific Stressors or current issues interfering with healing process. Setting desired goal for each stressor or current issue identified.;Long Term Goal: Stressors or current issues are controlled or eliminated.;Short Term goal: Identification and review with participant of any Quality of Life or Depression concerns found by scoring the questionnaire.;Long Term goal: The participant improves quality of Life and PHQ9 Scores as seen by post scores and/or verbalization of changes             Quality of Life Scores:  Scores of 19 and below usually indicate a poorer quality of life in these areas.  A difference of  2-3 points is a clinically meaningful difference.  A difference of 2-3 points in the total score of the Quality of Life Index has been associated with significant improvement in overall quality of life, self-image, physical symptoms, and general health in studies assessing change in quality of life.  PHQ-9: Review Flowsheet       12/20/2022 03/24/2014 03/19/2013 01/19/2013  Depression screen PHQ 2/9  Decreased Interest 0 0 0 0  Down, Depressed, Hopeless 0 0 0 0  PHQ - 2 Score 0 0 0 0  Altered sleeping 2 - - -  Tired, decreased energy 0 - - -   Change in appetite 1 - - -  Feeling bad or failure about yourself  0 - - -  Trouble concentrating 0 - - -  Moving slowly or fidgety/restless 0 - - -  Suicidal thoughts 0 - - -  PHQ-9 Score 3 - - -  Difficult doing work/chores Not difficult at all - - -   Interpretation of Total Score  Total Score Depression Severity:  1-4 = Minimal depression, 5-9 = Mild depression, 10-14 = Moderate depression, 15-19 = Moderately severe depression, 20-27 = Severe depression   Psychosocial Evaluation and Intervention:  Psychosocial Evaluation - 12/14/22 1521       Psychosocial Evaluation & Interventions   Interventions Encouraged to exercise with the program and follow exercise prescription    Comments Jaydalee has no barriers to attending the program. She continues to work and lives alone. She has 2 friends that are her support and her boss at work supports her too. She wants to work on improving her exercise regimen as she has never exercised on a routine basis ever.   She does have scoliosis and some arthritis. She does not feel that will hinder her exercising. She is ready to get started.    Expected Outcomes STG Keionna attends all scheduled sessions, she is able to progress with her exercise . LTG Dayanis continues her exercise progression after discharge             Psychosocial Re-Evaluation:  Psychosocial Re-Evaluation     [place] Name 01/11/23 [phone]             Psychosocial Re-Evaluation   Current issues with None Identified       Comments Sascha is doing well in rehab. She denies any major stressors.  She is still working part time.  She used to pride herself on being a great sleeper, but now a days she does not sleep as well.  She is having a rough breathing day today and balance which has her concerned today, but she has not had as much to drink.       Expected Outcomes Short: Continue to exercise routinely for mental boost Long: Continue to focus on positive and sleep       Interventions  Encouraged to attend Pulmonary Rehabilitation for the exercise;Stress management education       Continue Psychosocial Services  Follow up required by staff                Psychosocial Discharge (Final Psychosocial Re-Evaluation):  Psychosocial Re-Evaluation - 01/11/23 0733       Psychosocial Re-Evaluation   Current issues with None Identified    Comments Jennett is doing well in rehab. She denies any major stressors.  She is still working part time.  She used to pride herself on being a great sleeper, but now a days she does not sleep as well.  She is having a rough breathing day today and balance which has her concerned today, but she has not had as much to drink.    Expected Outcomes Short: Continue to exercise routinely for mental boost Long: Continue to focus on positive and sleep    Interventions Encouraged to attend Pulmonary Rehabilitation for the exercise;Stress management education    Continue Psychosocial Services  Follow up required by staff  Education: Education Goals: Education classes will be provided on a weekly basis, covering required topics. Participant will state understanding/return demonstration of topics presented.  Learning Barriers/Preferences:   General Pulmonary Education Topics:  Infection Prevention: - Provides verbal and written material to individual with discussion of infection control including proper hand washing and proper equipment cleaning during exercise session. Flowsheet Row Pulmonary Rehab from 01/18/2023 in [facility] Cardiac and Pulmonary Rehab  Education need identified 12/20/22  Date 12/20/22  Educator KW  Instruction Review Code 1- Verbalizes Understanding       Falls Prevention: - Provides verbal and written material to individual with discussion of falls prevention and safety. Flowsheet Row Pulmonary Rehab from 12/14/2022 in [facility] Cardiac and Pulmonary Rehab  Date 12/14/22  Educator SB  Instruction Review Code 1-  Verbalizes Understanding       Chronic Lung Disease Review: - Group verbal instruction with posters, models, PowerPoint presentations and videos,  to review new updates, new respiratory medications, new advancements in procedures and treatments. Providing information on websites and "800" numbers for continued self-education. Includes information about supplement oxygen, available portable oxygen systems, continuous and intermittent flow rates, oxygen safety, concentrators, and Medicare reimbursement for oxygen. Explanation of Pulmonary Drugs, including class, frequency, complications, importance of spacers, rinsing mouth after steroid MDI's, and proper cleaning methods for nebulizers. Review of basic lung anatomy and physiology related to function, structure, and complications of lung disease. Review of risk factors. Discussion about methods for diagnosing sleep apnea and types of masks and machines for OSA. Includes a review of the use of types of environmental controls: home humidity, furnaces, filters, dust mite/pet prevention, HEPA vacuums. Discussion about weather changes, air quality and the benefits of nasal washing. Instruction on Warning signs, infection symptoms, calling MD promptly, preventive modes, and value of vaccinations. Review of effective airway clearance, coughing and/or vibration techniques. Emphasizing that all should Create an Action Plan. Written material given at graduation. Flowsheet Row Pulmonary Rehab from 01/18/2023 in [facility] Cardiac and Pulmonary Rehab  Education need identified 12/20/22       AED/CPR: - Group verbal and written instruction with the use of models to demonstrate the basic use of the AED with the basic ABC's of resuscitation.    Anatomy and Cardiac Procedures: - Group verbal and visual presentation and models provide information about basic cardiac anatomy and function. Reviews the testing methods done to diagnose heart disease and the outcomes of the  test results. Describes the treatment choices: Medical Management, Angioplasty, or Coronary Bypass Surgery for treating various heart conditions including Myocardial Infarction, Angina, Valve Disease, and Cardiac Arrhythmias.  Written material given at graduation.   Medication Safety: - Group verbal and visual instruction to review commonly prescribed medications for heart and lung disease. Reviews the medication, class of the drug, and side effects. Includes the steps to properly store meds and maintain the prescription regimen.  Written material given at graduation. Flowsheet Row Pulmonary Rehab from 01/18/2023 in [facility] Cardiac and Pulmonary Rehab  Date 12/28/22  Educator [facility]  Instruction Review Code 1- Verbalizes Understanding       Other: -Provides group and verbal instruction on various topics (see comments)   Knowledge Questionnaire Score:  Knowledge Questionnaire Score - 12/20/22 1637       Knowledge Questionnaire Score   Pre Score 12/18              Core Components/Risk Factors/Patient Goals at Admission:  Personal Goals and Risk Factors at Admission - 12/20/22 1716  Core Components/Risk Factors/Patient Goals on Admission    Weight Management Yes;Weight Maintenance    Intervention Weight Management: Develop a combined nutrition and exercise program designed to reach desired caloric intake, while maintaining appropriate intake of nutrient and fiber, sodium and fats, and appropriate energy expenditure required for the weight goal.;Weight Management: Provide education and appropriate resources to help participant work on and attain dietary goals.;Weight Management/Obesity: Establish reasonable short term and long term weight goals.    Admit Weight 129 lb (58.5 kg)    Goal Weight: Short Term 129 lb (58.5 kg)    Goal Weight: Long Term 129 lb (58.5 kg)    Expected Outcomes Short Term: Continue to assess and modify interventions until short term weight is achieved;Long  Term: Adherence to nutrition and physical activity/exercise program aimed toward attainment of established weight goal;Weight Maintenance: Understanding of the daily nutrition guidelines, which includes 25-35% calories from fat, 7% or less cal from saturated fats, less than 200mg  cholesterol, less than 1.5gm of sodium, & 5 or more servings of fruits and vegetables daily;Understanding recommendations for meals to include 15-35% energy as protein, 25-35% energy from fat, 35-60% energy from carbohydrates, less than 200mg  of dietary cholesterol, 20-35 gm of total fiber daily;Understanding of distribution of calorie intake throughout the day with the consumption of 4-5 meals/snacks    Hypertension Yes   kidney disease   Intervention Provide education on lifestyle modifcations including regular physical activity/exercise, weight management, moderate sodium restriction and increased consumption of fresh fruit, vegetables, and low fat dairy, alcohol moderation, and smoking cessation.;Monitor prescription use compliance.    Expected Outcomes Short Term: Continued assessment and intervention until BP is < 140/4mm HG in hypertensive participants. < 130/13mm HG in hypertensive participants with diabetes, heart failure or chronic kidney disease.;Long Term: Maintenance of blood pressure at goal levels.    Lipids Yes    Intervention Provide education and support for participant on nutrition & aerobic/resistive exercise along with prescribed medications to achieve LDL 70mg , HDL >40mg .    Expected Outcomes Short Term: Participant states understanding of desired cholesterol values and is compliant with medications prescribed. Participant is following exercise prescription and nutrition guidelines.;Long Term: Cholesterol controlled with medications as prescribed, with individualized exercise RX and with personalized nutrition plan. Value goals: LDL < 70mg , HDL > 40 mg.             Education:Diabetes - Individual  verbal and written instruction to review signs/symptoms of diabetes, desired ranges of glucose level fasting, after meals and with exercise. Acknowledge that pre and post exercise glucose checks will be done for 3 sessions at entry of program.   Know Your Numbers and Heart Failure: - Group verbal and visual instruction to discuss disease risk factors for cardiac and pulmonary disease and treatment options.  Reviews associated critical values for Overweight/Obesity, Hypertension, Cholesterol, and Diabetes.  Discusses basics of heart failure: signs/symptoms and treatments.  Introduces Heart Failure Zone chart for action plan for heart failure.  Written material given at graduation. Flowsheet Row Pulmonary Rehab from 01/18/2023 in [facility] Cardiac and Pulmonary Rehab  Date 01/04/23  Educator [facility]  Instruction Review Code 1- Verbalizes Understanding       Core Components/Risk Factors/Patient Goals Review:   Goals and Risk Factor Review     Row Name 01/04/23 0802 01/04/23 0837           Core Components/Risk Factors/Patient Goals Review   Personal Goals Review Weight Management/Obesity;Hypertension Weight Management/Obesity;Hypertension      Review Wants to [facility]  weight..  BP cuff-. Patient states she would like to maintain her weight. She weighs around 124 lb. We discussed importance of trying to maintain muscle mass given age. She has decided to talk to the RD and will be making an appointment with her. She does have a  BP cuff at home but admits she does not normally check it. Encouraged her to get in the habit of doing so, reviewed importance and to be aware of how she is feeling all the time. Talk to MD if there are any concerns.      Expected Outcomes -- Short: Start checking BP at home, keep log Long: Continue to manage lifestyle risk factors               Core Components/Risk Factors/Patient Goals at Discharge (Final Review):   Goals and Risk Factor Review - 01/04/23 0837       Core  Components/Risk Factors/Patient Goals Review   Personal Goals Review Weight Management/Obesity;Hypertension    Review Patient states she would like to maintain her weight. She weighs around 124 lb. We discussed importance of trying to maintain muscle mass given age. She has decided to talk to the RD and will be making an appointment with her. She does have a  BP cuff at home but admits she does not normally check it. Encouraged her to get in the habit of doing so, reviewed importance and to be aware of how she is feeling all the time. Talk to MD if there are any concerns.    Expected Outcomes Short: Start checking BP at home, keep log Long: Continue to manage lifestyle risk factors             ITP Comments:  ITP Comments     Row Name 12/14/22 1520 12/20/22 1639 12/25/22 1014 12/26/22 2059 12/27/22 1308   ITP Comments Virtual orientation call completed today. shehas an appointment on Date: 12/20/2022  for EP eval and gym Orientation.  Documentation of diagnosis can be found in [facility] Date: [id number] . Completed 6MWT and gym orientation. Initial ITP created and sent for review to Dr. Ottie Glazier, Medical Director. First full day of exercise!  Patient was oriented to gym and equipment including functions, settings, policies, and procedures.  Patient's individual exercise prescription and treatment plan were reviewed.  All starting workloads were established based on the results of the 6 minute walk test done at initial orientation visit.  The plan for exercise progression was also introduced and progression will be customized based on patient's performance and goals. Patient requested at gym orientation about the process of DNR. RN had thorough explanation with patient on process on how to proceed as DNR under rehab. Patient was told she would need to sign a new consent with rehab in order to correct her code. She was also given the number to call to change her status in her medical chart under [facility]. Until the new consent is signed, patient agrees to exercise under our consent to treat. Patient expressed understanding and agreed to start exercise. 30 day review completed. ITP sent to Dr. Zetta Bills, Medical Director of  Pulmonary Rehab. Continue with ITP unless changes are made by physician. Pt new to program, oriented on 12/20/22.    [place] Name 01/11/23 1215 01/24/23 0832         ITP Comments Completed initial RD consultation 30 Day review completed. Medical Director ITP review done, changes made as directed, and signed approval by Medical Director.  Comments:

## 2023-01-25 ENCOUNTER — Encounter: Payer: HMO | Admitting: *Deleted

## 2023-01-25 DIAGNOSIS — J439 Emphysema, unspecified: Secondary | ICD-10-CM

## 2023-01-25 DIAGNOSIS — J479 Bronchiectasis, uncomplicated: Secondary | ICD-10-CM

## 2023-01-25 NOTE — Progress Notes (Signed)
Daily Session Note  Patient Details  Name: Kristen Bartlett MRN: [id number] Date of Birth: 09-09-1941 Referring Provider:   Flowsheet Row Pulmonary Rehab from 12/20/2022 in [facility] Cardiac and Pulmonary Rehab  Referring Provider Kirke Shaggy MD       Encounter Date: 01/25/2023  Check In:  Session Check In - 01/25/23 0720       Check-In   Supervising physician immediately available to respond to emergencies See telemetry face sheet for immediately available ER MD    Location [facility]-Cardiac & Pulmonary Rehab    Staff Present Darlyne Russian, RN, ADN;Joseph [place], RCP,RRT,BSRT;Jessica [place], MA, RCEP, CCRP, CCET;Krista Frederico Hamman RN, BSN    Virtual Visit No    Medication changes reported     No    Fall or balance concerns reported    No    Warm-up and Cool-down Performed on first and last piece of equipment    Resistance Training Performed Yes    VAD Patient? No    PAD/SET Patient? No      Pain Assessment   Currently in Pain? No/denies                Social History   Tobacco Use  Smoking Status Former   Packs/day: 2.00   Years: 30.00   Additional pack years: 0.00   Total pack years: 60.00   Types: Cigarettes   Quit date: 12/09/1983   Years since quitting: 39.1  Smokeless Tobacco Never    Goals Met:  Independence with exercise equipment Exercise tolerated well No report of concerns or symptoms today Strength training completed today  Goals Unmet:  Not Applicable  Comments: Pt able to follow exercise prescription today without complaint.  Will continue to monitor for progression.    Dr. Emily Filbert is Medical Director for [place].  Dr. Ottie Glazier is Medical Director for [facility] Pulmonary Rehabilitation.

## 2023-01-29 ENCOUNTER — Encounter: Payer: HMO | Admitting: *Deleted

## 2023-01-29 DIAGNOSIS — J439 Emphysema, unspecified: Secondary | ICD-10-CM | POA: Diagnosis not present

## 2023-01-29 DIAGNOSIS — J479 Bronchiectasis, uncomplicated: Secondary | ICD-10-CM

## 2023-01-29 NOTE — Progress Notes (Signed)
Daily Session Note  Patient Details  Name: Jennilee Cullop MRN: [id number] Date of Birth: 1941-04-18 Referring Provider:   Flowsheet Row Pulmonary Rehab from 12/20/2022 in [facility] Cardiac and Pulmonary Rehab  Referring Provider Kirke Shaggy MD       Encounter Date: 01/29/2023  Check In:  Session Check In - 01/29/23 0724       Check-In   Supervising physician immediately available to respond to emergencies See telemetry face sheet for immediately available ER MD    Location [facility]-Cardiac & Pulmonary Rehab    Staff Present Darlyne Russian, RN, Doyce Para, BS, ACSM CEP, Exercise Physiologist;Joseph Tessie Fass, [place]    Virtual Visit No    Medication changes reported     No    Fall or balance concerns reported    No    Warm-up and Cool-down Performed on first and last piece of equipment    Resistance Training Performed Yes    VAD Patient? No    PAD/SET Patient? No      Pain Assessment   Currently in Pain? No/denies                Social History   Tobacco Use  Smoking Status Former   Packs/day: 2.00   Years: 30.00   Additional pack years: 0.00   Total pack years: 60.00   Types: Cigarettes   Quit date: 12/09/1983   Years since quitting: 39.1  Smokeless Tobacco Never    Goals Met:  Independence with exercise equipment Exercise tolerated well No report of concerns or symptoms today Strength training completed today  Goals Unmet:  Not Applicable  Comments: Pt able to follow exercise prescription today without complaint.  Will continue to monitor for progression.    Dr. Emily Filbert is Medical Director for [place].  Dr. Ottie Glazier is Medical Director for [facility] Pulmonary Rehabilitation.

## 2023-01-31 ENCOUNTER — Encounter: Payer: HMO | Admitting: *Deleted

## 2023-01-31 DIAGNOSIS — J439 Emphysema, unspecified: Secondary | ICD-10-CM | POA: Diagnosis not present

## 2023-01-31 DIAGNOSIS — J479 Bronchiectasis, uncomplicated: Secondary | ICD-10-CM

## 2023-01-31 NOTE — Progress Notes (Signed)
Daily Session Note  Patient Details  Name: Kristen Bartlett MRN: [id number] Date of Birth: 1941-10-19 Referring Provider:   Flowsheet Row Pulmonary Rehab from 12/20/2022 in [facility] Cardiac and Pulmonary Rehab  Referring Provider Kirke Shaggy MD       Encounter Date: 01/31/2023  Check In:  Session Check In - 01/31/23 0723       Check-In   Supervising physician immediately available to respond to emergencies See telemetry face sheet for immediately available ER MD    Location [facility]Cardiac & Pulmonary Rehab    Staff Present Justin Mend, RCP,RRT,BSRT;Kess Mcilwain Frederico Hamman RN, Odelia Gage, RN, ADN;Jessica [place], MA, RCEP, CCRP, CCET;Noah Tickle, BS, Exercise Physiologist    Virtual Visit No    Medication changes reported     No    Fall or balance concerns reported    No    Warm-up and Cool-down Performed on first and last piece of equipment    Resistance Training Performed Yes    VAD Patient? No    PAD/SET Patient? No      Pain Assessment   Currently in Pain? No/denies                Social History   Tobacco Use  Smoking Status Former   Packs/day: 2.00   Years: 30.00   Additional pack years: 0.00   Total pack years: 60.00   Types: Cigarettes   Quit date: 12/09/1983   Years since quitting: 39.1  Smokeless Tobacco Never    Goals Met:  Independence with exercise equipment Exercise tolerated well No report of concerns or symptoms today Strength training completed today  Goals Unmet:  Not Applicable  Comments: Pt able to follow exercise prescription today without complaint.  Will continue to monitor for progression.    Dr. Emily Filbert is Medical Director for [place].  Dr. Ottie Glazier is Medical Director for [facility] Pulmonary Rehabilitation.

## 2023-02-01 ENCOUNTER — Encounter: Payer: HMO | Admitting: *Deleted

## 2023-02-01 DIAGNOSIS — J479 Bronchiectasis, uncomplicated: Secondary | ICD-10-CM

## 2023-02-01 DIAGNOSIS — J439 Emphysema, unspecified: Secondary | ICD-10-CM | POA: Diagnosis not present

## 2023-02-01 NOTE — Progress Notes (Signed)
Daily Session Note  Patient Details  Name: Kristen Bartlett MRN: [id number] Date of Birth: [birth year] Referring Provider:   Flowsheet Row Pulmonary Rehab from 12/20/2022 in [facility] Cardiac and Pulmonary Rehab  Referring Provider Kirke Shaggy MD       Encounter Date: 02/01/2023  Check In:  Session Check In - 02/01/23 0733       Check-In   Supervising physician immediately available to respond to emergencies See telemetry face sheet for immediately available ER MD    Location [facility]Cardiac & Pulmonary Rehab    Staff Present Darlyne Russian, RN, ADN;Jessica Luan Pulling, MA, RCEP, CCRP, CCET;Joseph [place], [place]    Virtual Visit No    Medication changes reported     No    Fall or balance concerns reported    No    Warm-up and Cool-down Performed on first and last piece of equipment    Resistance Training Performed Yes    VAD Patient? No    PAD/SET Patient? No      Pain Assessment   Currently in Pain? No/denies                Social History   Tobacco Use  Smoking Status Former   Packs/day: 2.00   Years: 30.00   Additional pack years: 0.00   Total pack years: 60.00   Types: Cigarettes   Quit date: 12/09/1983   Years since quitting: 39.1  Smokeless Tobacco Never    Goals Met:  Independence with exercise equipment Exercise tolerated well No report of concerns or symptoms today Strength training completed today  Goals Unmet:  Not Applicable  Comments: Pt able to follow exercise prescription today without complaint.  Will continue to monitor for progression.    Dr. Emily Filbert is Medical Director for [place].  Dr. Ottie Glazier is Medical Director for [facility] Pulmonary Rehabilitation.

## 2023-02-05 ENCOUNTER — Encounter: Payer: HMO | Attending: Internal Medicine | Admitting: *Deleted

## 2023-02-05 DIAGNOSIS — J479 Bronchiectasis, uncomplicated: Secondary | ICD-10-CM | POA: Insufficient documentation

## 2023-02-05 DIAGNOSIS — J439 Emphysema, unspecified: Secondary | ICD-10-CM | POA: Diagnosis present

## 2023-02-05 NOTE — Progress Notes (Signed)
Daily Session Note  Patient Details  Name: Kristen Bartlett MRN: [id number] Date of Birth: [birth year] Referring Provider:   Flowsheet Row Pulmonary Rehab from 12/20/2022 in [facility] Cardiac and Pulmonary Rehab  Referring Provider Kirke Shaggy MD       Encounter Date: 02/05/2023  Check In:  Session Check In - 02/05/23 0815       Check-In   Supervising physician immediately available to respond to emergencies See telemetry face sheet for immediately available ER MD    Location [facility]Cardiac & Pulmonary Rehab    Staff Present Darlyne Russian, RN, Doyce Para, BS, ACSM CEP, Exercise Physiologist;Joseph Tessie Fass, [place]    Virtual Visit No    Medication changes reported     No    Fall or balance concerns reported    No    Warm-up and Cool-down Performed on first and last piece of equipment    Resistance Training Performed Yes    VAD Patient? No    PAD/SET Patient? No      Pain Assessment   Currently in Pain? No/denies                Social History   Tobacco Use  Smoking Status Former   Packs/day: 2.00   Years: 30.00   Additional pack years: 0.00   Total pack years: 60.00   Types: Cigarettes   Quit date: 12/09/1983   Years since quitting: 39.1  Smokeless Tobacco Never    Goals Met:  Independence with exercise equipment Exercise tolerated well No report of concerns or symptoms today Strength training completed today  Goals Unmet:  Not Applicable  Comments: Pt able to follow exercise prescription today without complaint.  Will continue to monitor for progression.    Dr. Emily Filbert is Medical Director for [place].  Dr. Ottie Glazier is Medical Director for [facility] Pulmonary Rehabilitation.

## 2023-02-07 ENCOUNTER — Encounter: Payer: HMO | Admitting: *Deleted

## 2023-02-07 DIAGNOSIS — J439 Emphysema, unspecified: Secondary | ICD-10-CM

## 2023-02-07 DIAGNOSIS — J479 Bronchiectasis, uncomplicated: Secondary | ICD-10-CM

## 2023-02-07 NOTE — Progress Notes (Signed)
Daily Session Note  Patient Details  Name: Kristen Bartlett MRN: [id number] Date of Birth: [birth year] Referring Provider:   Flowsheet Row Pulmonary Rehab from 12/20/2022 in [facility] Cardiac and Pulmonary Rehab  Referring Provider Kirke Shaggy MD       Encounter Date: 02/07/2023  Check In:  Session Check In - 02/07/23 0745       Check-In   Supervising physician immediately available to respond to emergencies See telemetry face sheet for immediately available ER MD    Location [facility]Cardiac & Pulmonary Rehab    Staff Present Darlyne Russian, RN, Dimple Nanas, BS, Exercise Physiologist;Joseph Tessie Fass, [place]    Virtual Visit No    Medication changes reported     No    Fall or balance concerns reported    No    Warm-up and Cool-down Performed on first and last piece of equipment    Resistance Training Performed Yes    VAD Patient? No    PAD/SET Patient? No      Pain Assessment   Currently in Pain? No/denies                Social History   Tobacco Use  Smoking Status Former   Packs/day: 2.00   Years: 30.00   Additional pack years: 0.00   Total pack years: 60.00   Types: Cigarettes   Quit date: 12/09/1983   Years since quitting: 39.1  Smokeless Tobacco Never    Goals Met:  Independence with exercise equipment Exercise tolerated well No report of concerns or symptoms today Strength training completed today  Goals Unmet:  Not Applicable  Comments: Pt able to follow exercise prescription today without complaint.  Will continue to monitor for progression.    Dr. Emily Filbert is Medical Director for [place].  Dr. Ottie Glazier is Medical Director for [facility] Pulmonary Rehabilitation.

## 2023-02-08 ENCOUNTER — Encounter: Payer: HMO | Admitting: *Deleted

## 2023-02-08 DIAGNOSIS — J439 Emphysema, unspecified: Secondary | ICD-10-CM | POA: Diagnosis not present

## 2023-02-08 DIAGNOSIS — J479 Bronchiectasis, uncomplicated: Secondary | ICD-10-CM

## 2023-02-08 NOTE — Progress Notes (Signed)
Daily Session Note  Patient Details  Name: Kristen Bartlett MRN: [id number] Date of Birth: August 20, 1941 Referring Provider:   Flowsheet Row Pulmonary Rehab from 12/20/2022 in [facility] Cardiac and Pulmonary Rehab  Referring Provider Kirke Shaggy MD       Encounter Date: 02/08/2023  Check In:  Session Check In - 02/08/23 0725       Check-In   Supervising physician immediately available to respond to emergencies See telemetry face sheet for immediately available ER MD    Location [facility]Cardiac & Pulmonary Rehab    Staff Present Darlyne Russian, RN, Lorin Mercy, MS, ACSM CEP, Exercise Physiologist;Joseph Tessie Fass, [place]    Virtual Visit No    Medication changes reported     No    Fall or balance concerns reported    No    Warm-up and Cool-down Performed on first and last piece of equipment    Resistance Training Performed Yes    VAD Patient? No    PAD/SET Patient? No      Pain Assessment   Currently in Pain? No/denies                Social History   Tobacco Use  Smoking Status Former   Packs/day: 2.00   Years: 30.00   Additional pack years: 0.00   Total pack years: 60.00   Types: Cigarettes   Quit date: 12/09/1983   Years since quitting: 39.1  Smokeless Tobacco Never    Goals Met:  Independence with exercise equipment Exercise tolerated well No report of concerns or symptoms today Strength training completed today  Goals Unmet:  Not Applicable  Comments: Pt able to follow exercise prescription today without complaint.  Will continue to monitor for progression.    Dr. Emily Filbert is Medical Director for [facility].  Dr. Ottie Glazier is Medical Director for [facility] Pulmonary Rehabilitation.

## 2023-02-12 ENCOUNTER — Encounter: Payer: HMO | Admitting: *Deleted

## 2023-02-12 DIAGNOSIS — J439 Emphysema, unspecified: Secondary | ICD-10-CM | POA: Diagnosis not present

## 2023-02-12 DIAGNOSIS — J479 Bronchiectasis, uncomplicated: Secondary | ICD-10-CM

## 2023-02-12 NOTE — Progress Notes (Signed)
Daily Session Note  Patient Details  Name: Kristen Bartlett MRN: [id number] Date of Birth: [birth year] Referring Provider:   Flowsheet Row Pulmonary Rehab from 12/20/2022 in [facility] Cardiac and Pulmonary Rehab  Referring Provider Rosary Lively MD       Encounter Date: 02/12/2023  Check In:  Session Check In - 02/12/23 0739       Check-In   Supervising physician immediately available to respond to emergencies See telemetry face sheet for immediately available ER MD    Location [facility]-Cardiac & Pulmonary Rehab    Staff Present Lanny Hurst, RN, ADN;Joseph Reino Kent, RCP,RRT,BSRT;Kelly Madilyn Fireman, BS, ACSM CEP, Exercise Physiologist    Virtual Visit No    Medication changes reported     No    Fall or balance concerns reported    No    Warm-up and Cool-down Performed on first and last piece of equipment    Resistance Training Performed Yes    VAD Patient? No    PAD/SET Patient? No      Pain Assessment   Currently in Pain? No/denies                Social History   Tobacco Use  Smoking Status Former   Packs/day: 2.00   Years: 30.00   Additional pack years: 0.00   Total pack years: 60.00   Types: Cigarettes   Quit date: 12/09/1983   Years since quitting: 39.2  Smokeless Tobacco Never    Goals Met:  Independence with exercise equipment Exercise tolerated well No report of concerns or symptoms today Strength training completed today  Goals Unmet:  Not Applicable  Comments: Pt able to follow exercise prescription today without complaint.  Will continue to monitor for progression.    Dr. Bethann Punches is Medical Director for [facility] Cardiac Rehabilitation.  Dr. Vida Rigger is Medical Director for [facility] Pulmonary Rehabilitation.

## 2023-02-14 ENCOUNTER — Encounter: Payer: HMO | Admitting: *Deleted

## 2023-02-14 DIAGNOSIS — J479 Bronchiectasis, uncomplicated: Secondary | ICD-10-CM

## 2023-02-14 DIAGNOSIS — J439 Emphysema, unspecified: Secondary | ICD-10-CM

## 2023-02-14 NOTE — Progress Notes (Signed)
Daily Session Note  Patient Details  Name: Kristen Bartlett MRN: [id number] Date of Birth: [birth year] Referring Provider:   Flowsheet Row Pulmonary Rehab from 12/20/2022 in [facility] Cardiac and Pulmonary Rehab  Referring Provider Rosary Lively MD       Encounter Date: 02/14/2023  Check In:  Session Check In - 02/14/23 0726       Check-In   Location [facility]-Cardiac & Pulmonary Rehab    Staff Present Lanny Hurst, RN, ADN;Joseph Reino Kent, RCP,RRT,BSRT;Noah Tickle, BS, Exercise Physiologist    Virtual Visit No    Medication changes reported     No    Fall or balance concerns reported    No    Warm-up and Cool-down Performed on first and last piece of equipment    Resistance Training Performed Yes    VAD Patient? No    PAD/SET Patient? No      Pain Assessment   Currently in Pain? No/denies                Social History   Tobacco Use  Smoking Status Former   Packs/day: 2.00   Years: 30.00   Additional pack years: 0.00   Total pack years: 60.00   Types: Cigarettes   Quit date: 12/09/1983   Years since quitting: 39.2  Smokeless Tobacco Never    Goals Met:  Independence with exercise equipment Exercise tolerated well No report of concerns or symptoms today Strength training completed today  Goals Unmet:  Not Applicable  Comments: Pt able to follow exercise prescription today without complaint.  Will continue to monitor for progression.    Dr. Bethann Punches is Medical Director for [facility] Cardiac Rehabilitation.  Dr. Vida Rigger is Medical Director for [facility] Pulmonary Rehabilitation.

## 2023-02-15 ENCOUNTER — Ambulatory Visit
Admission: EM | Admit: 2023-02-15 | Discharge: 2023-02-15 | Disposition: A | Discharge status: Home / Self Care | Payer: HMO | Attending: Emergency Medicine | Admitting: Emergency Medicine

## 2023-02-15 ENCOUNTER — Encounter: Payer: Self-pay | Admitting: Emergency Medicine

## 2023-02-15 ENCOUNTER — Encounter: Payer: HMO | Admitting: *Deleted

## 2023-02-15 DIAGNOSIS — J439 Emphysema, unspecified: Secondary | ICD-10-CM | POA: Diagnosis not present

## 2023-02-15 DIAGNOSIS — J479 Bronchiectasis, uncomplicated: Secondary | ICD-10-CM

## 2023-02-15 DIAGNOSIS — S61411A Laceration without foreign body of right hand, initial encounter: Secondary | ICD-10-CM | POA: Diagnosis not present

## 2023-02-15 NOTE — Discharge Instructions (Addendum)
See the attached information on laceration care.  Follow up with your primary care provider right away if you have signs of infection.

## 2023-02-15 NOTE — ED Triage Notes (Signed)
Pt hit her right hand and said she has very thin skin. She is wanting it checked to makes sure it dont get infeceted

## 2023-02-15 NOTE — Progress Notes (Signed)
Daily Session Note  Patient Details  Name: Kristen Bartlett MRN: [id number] Date of Birth: 08-30-1941 Referring Provider:   Flowsheet Row Pulmonary Rehab from 12/20/2022 in [facility] Cardiac and Pulmonary Rehab  Referring Provider Rosary Lively MD       Encounter Date: 02/15/2023  Check In:  Session Check In - 02/15/23 0735       Check-In   Supervising physician immediately available to respond to emergencies See telemetry face sheet for immediately available ER MD    Location [facility]-Cardiac & Pulmonary Rehab    Staff Present Lanny Hurst, RN, ADN;Jessica Juanetta Gosling, MA, RCEP, CCRP, CCET;Joseph [place], [place]    Virtual Visit No    Medication changes reported     No    Fall or balance concerns reported    No    Warm-up and Cool-down Performed on first and last piece of equipment    Resistance Training Performed Yes    VAD Patient? No    PAD/SET Patient? No      Pain Assessment   Currently in Pain? No/denies                Social History   Tobacco Use  Smoking Status Former   Packs/day: 2.00   Years: 30.00   Additional pack years: 0.00   Total pack years: 60.00   Types: Cigarettes   Quit date: 12/09/1983   Years since quitting: 39.2  Smokeless Tobacco Never    Goals Met:  Independence with exercise equipment Exercise tolerated well No report of concerns or symptoms today Strength training completed today  Goals Unmet:  Not Applicable  Comments: Pt able to follow exercise prescription today without complaint.  Will continue to monitor for progression.    Dr. Bethann Punches is Medical Director for [facility] Cardiac Rehabilitation.  Dr. Vida Rigger is Medical Director for [facility] Pulmonary Rehabilitation.

## 2023-02-15 NOTE — ED Provider Notes (Signed)
[facility]    CSN: [id number] Arrival date & time: 02/15/23  0915      History   Chief Complaint Chief Complaint  Patient presents with   Abrasion    HPI Kristen Bartlett is a 82 y.o. female.  Patient presents with a skin tear on her right hand that occurred yesterday afternoon when she accidentally hit her hand.   Bleeding controlled with dressing.  No fever, wound drainage, redness, or other symptoms.  Last tetanus 06/26/2019.  Her medical history includes emphysema, hypertension, hypothyroidism, GERD.  The history is provided by the patient and medical records.    Past Medical History:  Diagnosis Date   Arthritis    GERD (gastroesophageal reflux disease)    Heart murmur    Hypercholesterolemia    Hypertension    Hypothyroidism    Syncope and collapse    Thin basement membrane disease    Thin basement membrane disease    Thin basement membrane disease     Patient Active Problem List   Diagnosis Date Noted   Cellulitis of right hand 06/26/2019   Cough 08/02/2014   Sty 08/02/2014   Hip pain, acute 10/05/2013   Carpal tunnel syndrome 07/26/2013   Syncope 03/27/2013   Essential hypertension, benign 01/19/2013   GERD (gastroesophageal reflux disease) 01/19/2013   Hypercholesterolemia 01/19/2013   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      Home Medications    Prior to Admission medications   Medication Sig Start Date End Date Taking? Authorizing Provider  acetaminophen (TYLENOL) 500 MG tablet Take 1,500 mg by mouth every 6 (six) hours as needed for moderate pain.    [provider]  acyclovir (ZOVIRAX) 200 MG capsule Take 1 capsule (200 mg total) by mouth daily. Patient not taking: Reported on 12/14/2022 07/28/14   Dale DurhamScott, Charlene, MD  amLODipine (NORVASC)  10 MG tablet Take 10 mg by mouth daily. 06/09/19 06/08/20  [provider]  aspirin 81 MG tablet Take 81 mg by mouth daily. Patient not taking: Reported on 12/14/2022    [provider]  Calcium Carb-Cholecalciferol (CALCIUM + D3 PO) Take 600 mg by mouth 2 (two) times daily. Patient not taking: Reported on 12/14/2022    [provider]  ezetimibe (ZETIA) 10 MG tablet Take 1 tablet by mouth daily. 04/24/22   [provider]  famotidine (PEPCID) 20 MG tablet Take 20 mg by mouth 2 (two) times daily. 03/20/19   [provider]  furosemide (LASIX) 20 MG tablet Take 20 mg by mouth daily as needed.    [provider]  levothyroxine (SYNTHROID, LEVOTHROID) 50 MCG tablet Take 1 tablet (50 mcg total) by mouth daily. 06/11/14   Dale DurhamScott, Charlene, MD  losartan (COZAAR) 50 MG tablet Take 50 mg by mouth daily. 04/29/19   [provider]  Multiple Vitamin (MULTIVITAMIN) tablet Take 1 tablet by mouth daily. Patient not taking: Reported on 12/14/2022    [provider]  pantoprazole (PROTONIX) 40 MG tablet Take 40 mg by mouth daily. Patient not taking: Reported on 12/14/2022    [provider]  ranitidine (ZANTAC) 150 MG capsule Take 150 mg by mouth daily. Patient not taking: Reported on 12/14/2022    [provider]    [facility]  [facility] Family History  Problem Relation Age of Onset   Arthritis Mother    Diabetes Father    Heart disease Father     Social History Social History   Tobacco Use   Smoking status: Former    Packs/day: 2.00    Years: 30.00    Additional pack years: 0.00    Total pack years: 60.00    Types: Cigarettes    Quit date: 12/09/1983    Years since quitting: 39.2   Smokeless tobacco: Never  Vaping Use   Vaping Use: Never used  Substance Use Topics   Alcohol use: Yes    Comment: occasional glass of wine   Drug use: No     Allergies   Statins, Lisinopril, Pravastatin, Rosuvastatin calcium, Meperidine, and  Sulfa antibiotics   Review of Systems Review of Systems  Constitutional:  Negative for chills and unexpected weight change.  Musculoskeletal:  Negative for arthralgias and joint swelling.  Skin:  Positive for wound. Negative for color change.  Neurological:  Negative for weakness and numbness.     Physical Exam Triage Vital Signs ED Triage Vitals  Enc Vitals Group     BP 02/15/23 0931 (!) 150/82     Pulse Rate 02/15/23 0928 98     Resp 02/15/23 0928 18     Temp 02/15/23 0928 97.8 F (36.6 C)     Temp src --      SpO2 02/15/23 0928 99 %     Weight --      Height --      Head Circumference --      Peak Flow --      Pain Score 02/15/23 0929 0     Pain Loc --      Pain Edu? --      Excl. in GC? --    No data found.  Updated Vital Signs BP (!) 150/82 (BP Location: Right Arm)   Pulse 73   Temp 97.8 F (36.6 C)   Resp 16   LMP 01/18/1984   SpO2 97%   Visual Acuity Right Eye Distance:   Left Eye Distance:   Bilateral Distance:    Right Eye Near:   Left Eye Near:    Bilateral Near:     Physical Exam Constitutional:      General: She is not in acute distress.    Appearance: Normal appearance. She is not ill-appearing.  HENT:     Mouth/Throat:     Mouth: Mucous membranes are moist.  Cardiovascular:     Rate and Rhythm: Normal rate and regular rhythm.     Heart sounds: Normal heart sounds.  Pulmonary:     Effort: Pulmonary effort is normal. No respiratory distress.     Breath sounds: Normal breath sounds.  Musculoskeletal:        General: No swelling or deformity. Normal range of motion.  Skin:    General: Skin is warm and dry.     Findings: Lesion present. No erythema.     Comments: Skin tear on dorsum of right hand.  No bleeding or drainage.  See picture.   Neurological:     General: No focal deficit present.     Mental Status: She is alert and oriented to person, place, and time.     Sensory: No sensory deficit.     Motor: No weakness.  Psychiatric:         Mood and Affect: Mood normal.  Behavior: Behavior normal.      UC Treatments / Results  Labs (all labs ordered are listed, but only abnormal results are displayed) Labs Reviewed - No data to display  EKG   Radiology No results found.  Procedures Laceration Repair  Date/Time: 02/15/2023 10:18 AM  Performed by: Mickie Bail, NP Authorized by: Mickie Bail, NP   Consent:    Consent obtained:  Verbal   Consent given by:  Patient   Risks discussed:  Infection, pain, poor cosmetic result and poor wound healing Universal protocol:    Procedure explained and questions answered to patient or proxy's satisfaction: yes   Anesthesia:    Anesthesia method:  None Laceration details:    Location:  Hand   Hand location:  R hand, dorsum   Wound length (cm): V-shaped. See picture.   Depth (mm):  1 Pre-procedure details:    Preparation:  Patient was prepped and draped in usual sterile fashion Exploration:    Hemostasis achieved with:  Direct pressure   Imaging outcome: foreign body not noted     Wound exploration: wound explored through full range of motion and entire depth of wound visualized   Treatment:    Area cleansed with:  Shur-Clens Skin repair:    Repair method:  Tissue adhesive Repair type:    Repair type:  Simple Post-procedure details:    Dressing:  Open (no dressing)   Procedure completion:  Tolerated well, no immediate complications  (including critical care time)  Medications Ordered in UC Medications - No data to display  Initial Impression / Assessment and Plan / UC Course  I have reviewed the triage vital signs and the nursing notes.  Pertinent labs & imaging results that were available during my care of the patient were reviewed by me and considered in my medical decision making (see chart for details).    Skin tear dorsum of the right hand.  Per patient request, closed with Dermabond.  No indication of infection at this time.  Wound care  instructions and signs of infection discussed..  Education provided on laceration care.  Instructed patient to follow-up with her PCP or return here right away if she notes signs of infection.  She agrees to plan of care.  Final Clinical Impressions(s) / UC Diagnoses   Final diagnoses:  Skin tear of right hand without complication, initial encounter     Discharge Instructions      See the attached information on laceration care.  Follow up with your primary care provider right away if you have signs of infection.        ED Prescriptions   None    PDMP not reviewed this encounter.   Mickie Bail, NP 02/15/23 1020

## 2023-02-19 ENCOUNTER — Encounter: Payer: HMO | Admitting: *Deleted

## 2023-02-19 DIAGNOSIS — J439 Emphysema, unspecified: Secondary | ICD-10-CM | POA: Diagnosis not present

## 2023-02-19 DIAGNOSIS — J479 Bronchiectasis, uncomplicated: Secondary | ICD-10-CM

## 2023-02-19 NOTE — Progress Notes (Signed)
Daily Session Note  Patient Details  Name: Kristen Bartlett MRN: [id number] Date of Birth: January 28, 1941 Referring Provider:   Flowsheet Row Pulmonary Rehab from 12/20/2022 in [facility] Cardiac and Pulmonary Rehab  Referring Provider Rosary Lively MD       Encounter Date: 02/19/2023  Check In:  Session Check In - 02/19/23 0738       Check-In   Supervising physician immediately available to respond to emergencies See telemetry face sheet for immediately available ER MD    Location [facility]-Cardiac & Pulmonary Rehab    Staff Present Lanny Hurst, RN, Franki Monte, BS, ACSM CEP, Exercise Physiologist;Joseph Reino [place], [place]    Virtual Visit No    Medication changes reported     No    Fall or balance concerns reported    No    Warm-up and Cool-down Performed on first and last piece of equipment    Resistance Training Performed Yes    VAD Patient? No    PAD/SET Patient? No      Pain Assessment   Currently in Pain? No/denies                Social History   Tobacco Use  Smoking Status Former   Packs/day: 2.00   Years: 30.00   Additional pack years: 0.00   Total pack years: 60.00   Types: Cigarettes   Quit date: 12/09/1983   Years since quitting: 39.2  Smokeless Tobacco Never    Goals Met:  Independence with exercise equipment Exercise tolerated well No report of concerns or symptoms today Strength training completed today  Goals Unmet:  Not Applicable  Comments: Pt able to follow exercise prescription today without complaint.  Will continue to monitor for progression.    Dr. Bethann Punches is Medical Director for [facility] Cardiac Rehabilitation.  Dr. Vida Rigger is Medical Director for [facility] Pulmonary Rehabilitation.

## 2023-02-21 ENCOUNTER — Encounter: Payer: Self-pay | Admitting: *Deleted

## 2023-02-21 ENCOUNTER — Encounter: Payer: HMO | Admitting: *Deleted

## 2023-02-21 DIAGNOSIS — J439 Emphysema, unspecified: Secondary | ICD-10-CM

## 2023-02-21 DIAGNOSIS — J479 Bronchiectasis, uncomplicated: Secondary | ICD-10-CM

## 2023-02-21 NOTE — Progress Notes (Signed)
Daily Session Note  Patient Details  Name: Kristen Bartlett MRN: [id number] Date of Birth: 1941/02/20 Referring Provider:   Flowsheet Row Pulmonary Rehab from 12/20/2022 in [facility] Cardiac and Pulmonary Rehab  Referring Provider Rosary Lively MD       Encounter Date: 02/21/2023  Check In:  Session Check In - 02/21/23 0722       Check-In   Supervising physician immediately available to respond to emergencies See telemetry face sheet for immediately available ER MD    Location [facility]-Cardiac & Pulmonary Rehab    Staff Present Lanny Hurst, RN, ADN;Joseph Reino [place], RCP,RRT,BSRT;Noah Tickle, BS, Exercise Physiologist    Virtual Visit No    Medication changes reported     No    Fall or balance concerns reported    No    Warm-up and Cool-down Performed on first and last piece of equipment    Resistance Training Performed Yes    VAD Patient? No    PAD/SET Patient? No      Pain Assessment   Currently in Pain? No/denies                Social History   Tobacco Use  Smoking Status Former   Packs/day: 2.00   Years: 30.00   Additional pack years: 0.00   Total pack years: 60.00   Types: Cigarettes   Quit date: 12/09/1983   Years since quitting: 39.2  Smokeless Tobacco Never    Goals Met:  Independence with exercise equipment Exercise tolerated well No report of concerns or symptoms today Strength training completed today  Goals Unmet:  Not Applicable  Comments: Pt able to follow exercise prescription today without complaint.  Will continue to monitor for progression.    Dr. Bethann Punches is Medical Director for [facility] Cardiac Rehabilitation.  Dr. Vida Rigger is Medical Director for [facility] Pulmonary Rehabilitation.

## 2023-02-21 NOTE — Progress Notes (Signed)
Pulmonary Individual Treatment Plan  Patient Details  Name: Kristen Bartlett MRN: [id number] Date of Birth: 30-Jan-1941 Referring Provider:   Flowsheet Row Pulmonary Rehab from 12/20/2022 in [facility] Cardiac and Pulmonary Rehab  Referring Provider Regino Schultze, Tai MD       Initial Encounter Date:  Flowsheet Row Pulmonary Rehab from 12/20/2022 in [facility] Cardiac and Pulmonary Rehab  Date 12/20/22       Visit Diagnosis: Pulmonary emphysema, unspecified emphysema type  Bronchiectasis without complication  Patient's Home Medications on Admission:  Current Outpatient Medications:    acetaminophen (TYLENOL) 500 MG tablet, Take 1,500 mg by mouth every 6 (six) hours as needed for moderate pain., Disp: , Rfl:    acyclovir (ZOVIRAX) 200 MG capsule, Take 1 capsule (200 mg total) by mouth daily. (Patient not taking: Reported on 12/14/2022), Disp: 90 capsule, Rfl: 1   amLODipine (NORVASC) 10 MG tablet, Take 10 mg by mouth daily., Disp: , Rfl:    aspirin 81 MG tablet, Take 81 mg by mouth daily. (Patient not taking: Reported on 12/14/2022), Disp: , Rfl:    Calcium Carb-Cholecalciferol (CALCIUM + D3 PO), Take 600 mg by mouth 2 (two) times daily. (Patient not taking: Reported on 12/14/2022), Disp: , Rfl:    ezetimibe (ZETIA) 10 MG tablet, Take 1 tablet by mouth daily., Disp: , Rfl:    famotidine (PEPCID) 20 MG tablet, Take 20 mg by mouth 2 (two) times daily., Disp: , Rfl:    furosemide (LASIX) 20 MG tablet, Take 20 mg by mouth daily as needed., Disp: , Rfl:    levothyroxine (SYNTHROID, LEVOTHROID) 50 MCG tablet, Take 1 tablet (50 mcg total) by mouth daily., Disp: 90 tablet, Rfl: 1   losartan (COZAAR) 50 MG tablet, Take 50 mg by mouth daily., Disp: , Rfl:    Multiple Vitamin (MULTIVITAMIN) tablet, Take 1 tablet by mouth daily. (Patient not taking: Reported on 12/14/2022), Disp: , Rfl:    pantoprazole (PROTONIX) 40 MG tablet, Take 40 mg by mouth daily. (Patient not taking: Reported on 12/14/2022), Disp: , Rfl:    ranitidine  (ZANTAC) 150 MG capsule, Take 150 mg by mouth daily. (Patient not taking: Reported on 12/14/2022), Disp: , Rfl:   Past Medical History: Past Medical History:  Diagnosis Date   Arthritis    GERD (gastroesophageal reflux disease)    Heart murmur    Hypercholesterolemia    Hypertension    Hypothyroidism    Syncope and collapse    Thin basement membrane disease    Thin basement membrane disease    Thin basement membrane disease     Tobacco Use: Social History   Tobacco Use  Smoking Status Former   Packs/day: 2.00   Years: 30.00   Additional pack years: 0.00   Total pack years: 60.00   Types: Cigarettes   Quit date: 12/09/1983   Years since quitting: 39.2  Smokeless Tobacco Never    Labs: Review Flowsheet       Latest Ref Rng & Units 07/28/2014  Labs for ITP Cardiac and Pulmonary Rehab  Cholestrol 0 - 200 mg/dL 401      LDL (calc) mg/dL 027      HDL-C 35 - 70 mg/dL 75      Trlycerides 40 - 160 mg/dL 76        Details       This result is from an external source.          Pulmonary Assessment Scores:  Pulmonary Assessment Scores     Row Name  12/20/22 1640         ADL UCSD   ADL Phase Entry     SOB Score total 17     Rest 0     Walk 0     Stairs 3     Bath 0     Dress 0     Shop 1       CAT Score   CAT Score 18       mMRC Score   mMRC Score 1              UCSD: Self-administered rating of dyspnea associated with activities of daily living (ADLs) 6-point scale (0 = "not at all" to 5 = "maximal or unable to do because of breathlessness")  Scoring Scores range from 0 to 120.  Minimally important difference is 5 units  CAT: CAT can identify the health impairment of COPD patients and is better correlated with disease progression.  CAT has a scoring range of zero to 40. The CAT score is classified into four groups of low (less than 10), medium (10 - 20), high (21-30) and very high (31-40) based on the impact level of disease on health status. A  CAT score over 10 suggests significant symptoms.  A worsening CAT score could be explained by an exacerbation, poor medication adherence, poor inhaler technique, or progression of COPD or comorbid conditions.  CAT MCID is 2 points  mMRC: mMRC (Modified Medical Research Council) Dyspnea Scale is used to assess the degree of baseline functional disability in patients of respiratory disease due to dyspnea. No minimal important difference is established. A decrease in score of 1 point or greater is considered a positive change.   Pulmonary Function Assessment:   Exercise Target Goals: Exercise Program Goal: Individual exercise prescription set using results from initial 6 min walk test and THRR while considering  patient's activity barriers and safety.   Exercise Prescription Goal: Initial exercise prescription builds to 30-45 minutes a day of aerobic activity, 2-3 days per week.  Home exercise guidelines will be given to patient during program as part of exercise prescription that the participant will acknowledge.  Education: Aerobic Exercise: - Group verbal and visual presentation on the components of exercise prescription. Introduces F.I.T.T principle from ACSM for exercise prescriptions.  Reviews F.I.T.T. principles of aerobic exercise including progression. Written material given at graduation. Flowsheet Row Pulmonary Rehab from 02/15/2023 in [facility] Cardiac and Pulmonary Rehab  Education need identified 12/20/22  Date 02/01/23  Educator [facility]  Instruction Review Code 1- Verbalizes Understanding       Education: Resistance Exercise: - Group verbal and visual presentation on the components of exercise prescription. Introduces F.I.T.T principle from ACSM for exercise prescriptions  Reviews F.I.T.T. principles of resistance exercise including progression. Written material given at graduation. Flowsheet Row Pulmonary Rehab from 02/15/2023 in [facility] Cardiac and Pulmonary Rehab  Date 02/08/23   Educator NT  Instruction Review Code 1- Verbalizes Understanding        Education: Exercise & Equipment Safety: - Individual verbal instruction and demonstration of equipment use and safety with use of the equipment. Flowsheet Row Pulmonary Rehab from 02/15/2023 in [facility] Cardiac and Pulmonary Rehab  Education need identified 12/20/22  Date 12/20/22  Educator KW  Instruction Review Code 1- Verbalizes Understanding       Education: Exercise Physiology & General Exercise Guidelines: - Group verbal and written instruction with models to review the exercise physiology of the cardiovascular system and associated critical values. Provides general  exercise guidelines with specific guidelines to those with heart or lung disease.  Flowsheet Row Pulmonary Rehab from 02/15/2023 in [facility] Cardiac and Pulmonary Rehab  Education need identified 12/20/22  Date 01/25/23  Educator [facility]  Instruction Review Code 1- [employer] Understanding       Education: Flexibility, Balance, Mind/Body Relaxation: - Group verbal and visual presentation with interactive activity on the components of exercise prescription. Introduces F.I.T.T principle from ACSM for exercise prescriptions. Reviews F.I.T.T. principles of flexibility and balance exercise training including progression. Also discusses the mind body connection.  Reviews various relaxation techniques to help reduce and manage stress (i.e. Deep breathing, progressive muscle relaxation, and visualization). Balance handout provided to take home. Written material given at graduation. Flowsheet Row Pulmonary Rehab from 02/15/2023 in [facility] Cardiac and Pulmonary Rehab  Date 02/08/23  Educator NT  Instruction Review Code 1- Verbalizes Understanding       Activity Barriers & Risk Stratification:  Activity Barriers & Cardiac Risk Stratification - 12/20/22 1647       Activity Barriers & Cardiac Risk Stratification   Activity Barriers Arthritis;Other  (comment);Deconditioning;Muscular Weakness    Comments Scoliosis and arthiritis of spine             6 Minute Walk:  6 Minute Walk     Row Name 12/20/22 1649         6 Minute Walk   Phase Initial     Distance 1130 feet     Walk Time 6 minutes     # of Rest Breaks 0     MPH 2.14     METS 1.83     RPE 13     Perceived Dyspnea  1     VO2 Peak 6.41     Symptoms Yes (comment)     Comments Fatigue     Resting HR 71 bpm     Resting BP 110/62     Resting Oxygen Saturation  98 %     Exercise Oxygen Saturation  during 6 min walk 97 %     Max Ex. HR 93 bpm     Max Ex. BP 124/62     2 Minute Post BP 112/62       Interval HR   1 Minute HR 84     2 Minute HR 90     3 Minute HR 93     4 Minute HR 91     5 Minute HR 92     6 Minute HR 89     2 Minute Post HR 69     Interval Heart Rate? Yes       Interval Oxygen   Interval Oxygen? Yes     Baseline Oxygen Saturation % 98 %     1 Minute Oxygen Saturation % 98 %     1 Minute Liters of Oxygen 0 L  RA     2 Minute Oxygen Saturation % 97 %     2 Minute Liters of Oxygen 0 L     3 Minute Oxygen Saturation % 98 %     3 Minute Liters of Oxygen 0 L     4 Minute Oxygen Saturation % 99 %     4 Minute Liters of Oxygen 0 L     5 Minute Oxygen Saturation % 98 %     5 Minute Liters of Oxygen 0 L     6 Minute Oxygen Saturation % 99 %     6 Minute Liters  of Oxygen 0 L     2 Minute Post Oxygen Saturation % 98 %     2 Minute Post Liters of Oxygen 0 L             Oxygen Initial Assessment:  Oxygen Initial Assessment - 02/01/23 0804       Home Oxygen   Home Oxygen Device None    Sleep Oxygen Prescription None    Home Exercise Oxygen Prescription None    Home Resting Oxygen Prescription None      Initial 6 min Walk   Oxygen Used None      Program Oxygen Prescription   Program Oxygen Prescription None      Intervention   Short Term Goals To learn and understand importance of monitoring SPO2 with pulse oximeter and  demonstrate accurate use of the pulse oximeter.;To learn and understand importance of maintaining oxygen saturations>88%;To learn and demonstrate proper pursed lip breathing techniques or other breathing techniques.     Long  Term Goals Maintenance of O2 saturations>88%;Exhibits proper breathing techniques, such as pursed lip breathing or other method taught during program session;Verbalizes importance of monitoring SPO2 with pulse oximeter and return demonstration             Oxygen Re-Evaluation:  Oxygen Re-Evaluation     Row Name 12/25/22 1015 01/11/23 0731 02/01/23 0804         Program Oxygen Prescription   Program Oxygen Prescription -- None --       Home Oxygen   Home Oxygen Device -- None --     Sleep Oxygen Prescription -- None --     Home Exercise Oxygen Prescription -- None --     Home Resting Oxygen Prescription -- None --       Goals/Expected Outcomes   Short Term Goals -- To learn and understand importance of monitoring SPO2 with pulse oximeter and demonstrate accurate use of the pulse oximeter.;To learn and understand importance of maintaining oxygen saturations>88%;To learn and demonstrate proper pursed lip breathing techniques or other breathing techniques.  --     Long  Term Goals -- Maintenance of O2 saturations>88%;Exhibits proper breathing techniques, such as pursed lip breathing or other method taught during program session;Verbalizes importance of monitoring SPO2 with pulse oximeter and return demonstration --     Comments Reviewed PLB technique with pt.  Talked about how it works and it's importance in maintaining their exercise saturations. Tonnia is doing well in rehab.  She is not having the best day today feeling wise.  She is using her PLB routinely.  She has a pulse oximeter and will bring it in to test again what we are getting in class. Patient states she has not felt a huge difference in her breathing but can tell it has not gotten worse. Overall, she feels  more conditioned and more endurance. She does not have a pulse ox but plans on purchasing one so she can start to monitor her O2 and HR at home. Her O2 stays well above 88% at rehab, she is aware she needs to stay above that. She does practice PLB when she feels she needs to.     Goals/Expected Outcomes Short: Become more profiecient at using PLB.   Long: Become independent at using PLB. Short: Bring in pulse oximeter Long: Continue to use PLB Short: Buy pulse ox, start watching O2 at home Long: Continue to become proficient at using PLB long-term  Oxygen Discharge (Final Oxygen Re-Evaluation):  Oxygen Re-Evaluation - 02/01/23 0804       Goals/Expected Outcomes   Comments Patient states she has not felt a huge difference in her breathing but can tell it has not gotten worse. Overall, she feels more conditioned and more endurance. She does not have a pulse ox but plans on purchasing one so she can start to monitor her O2 and HR at home. Her O2 stays well above 88% at rehab, she is aware she needs to stay above that. She does practice PLB when she feels she needs to.    Goals/Expected Outcomes Short: Buy pulse ox, start watching O2 at home Long: Continue to become proficient at using PLB long-term             Initial Exercise Prescription:  Initial Exercise Prescription - 12/20/22 1700       Date of Initial Exercise RX and Referring Provider   Date 12/20/22    Referring Provider Regino Schultze, Tai MD      Oxygen   Maintain Oxygen Saturation 88% or higher      Treadmill   MPH 1.6    Grade 0    Minutes 15    METs 2.23      NuStep   Level 1    SPM 80    Minutes 15    METs 1.8      Biostep-RELP   Level 1    SPM 50    Minutes 15    METs 1.8      Track   Laps 20    Minutes 15    METs 2.09      Prescription Details   Frequency (times per week) 3    Duration Progress to 30 minutes of continuous aerobic without signs/symptoms of physical distress      Intensity    THRR 40-80% of Max Heartrate 97 - 123    Ratings of Perceived Exertion 11-13    Perceived Dyspnea 0-4      Progression   Progression Continue to progress workloads to maintain intensity without signs/symptoms of physical distress.      Resistance Training   Training Prescription Yes    Weight 2 lb    Reps 10-15             Perform Capillary Blood Glucose checks as needed.  Exercise Prescription Changes:   Exercise Prescription Changes     Row Name 12/20/22 1700 01/02/23 1500 01/18/23 1400 01/31/23 1500 02/15/23 1400     Response to Exercise   Blood Pressure (Admit) 110/62 124/68 112/62 112/6 112/66   Blood Pressure (Exercise) 124/62 132/62 138/66 -- --   Blood Pressure (Exit) 112/62 104/62 108/60 112/60 102/60   Heart Rate (Admit) 71 bpm 68 bpm 73 bpm 61 bpm 70 bpm   Heart Rate (Exercise) 93 bpm 97 bpm 95 bpm 92 bpm 107 bpm   Heart Rate (Exit) 76 bpm 73 bpm 79 bpm 80 bpm 80 bpm   Oxygen Saturation (Admit) 98 % 96 % 97 % 98 % 100 %   Oxygen Saturation (Exercise) 97 % 90 % 94 % 97 % 91 %   Oxygen Saturation (Exit) 98 % 98 % 98 % 98 % 97 %   Rating of Perceived Exertion (Exercise) 13 14 15 15 13    Perceived Dyspnea (Exercise) 1 2 2 1 2    Symptoms Fatigue fatigue none none none   Comments walk test results 3rd full day of exercise -- -- --  Duration -- Progress to 30 minutes of  aerobic without signs/symptoms of physical distress Progress to 30 minutes of  aerobic without signs/symptoms of physical distress Continue with 30 min of aerobic exercise without signs/symptoms of physical distress. Continue with 30 min of aerobic exercise without signs/symptoms of physical distress.   Intensity -- THRR unchanged THRR unchanged THRR unchanged THRR unchanged     Progression   Progression -- Continue to progress workloads to maintain intensity without signs/symptoms of physical distress. Continue to progress workloads to maintain intensity without signs/symptoms of physical distress.  Continue to progress workloads to maintain intensity without signs/symptoms of physical distress. Continue to progress workloads to maintain intensity without signs/symptoms of physical distress.   Average METs -- 2.35 2.08 2.2 2.12     Resistance Training   Training Prescription -- Yes Yes Yes Yes   Weight -- 2 lb 2 lb 2 lb 3 lb   Reps -- 10-15 10-15 10-15 10-15     Interval Training   Interval Training -- No No No No     Treadmill   MPH -- 1.6 1.6 2 2    Grade -- 0 0 0 0   Minutes -- 15 15 15 15    METs -- 2.23 2.23 2.53 2.53     Recumbant Bike   Level -- 1 -- -- --   Minutes -- 15 -- -- --   METs -- 3.34 -- -- --     NuStep   Level -- 1 1 2 2    Minutes -- 15 15 15 15    METs -- 2 2 2 2      T5 Nustep   Level -- -- 1 -- --   Minutes -- -- 15 -- --   METs -- -- 1.7 -- --     Biostep-RELP   Level -- -- 1 -- --   Minutes -- -- 15 -- --   METs -- -- 2 -- --     Oxygen   Maintain Oxygen Saturation -- 88% or higher 88% or higher 88% or higher 88% or higher            Exercise Comments:   Exercise Comments     Row Name 12/25/22 1015           Exercise Comments First full day of exercise!  Patient was oriented to gym and equipment including functions, settings, policies, and procedures.  Patient's individual exercise prescription and treatment plan were reviewed.  All starting workloads were established based on the results of the 6 minute walk test done at initial orientation visit.  The plan for exercise progression was also introduced and progression will be customized based on patient's performance and goals.                Exercise Goals and Review:   Exercise Goals     Row Name 12/20/22 1716             Exercise Goals   Increase Physical Activity Yes       Intervention Provide advice, education, support and counseling about physical activity/exercise needs.;Develop an individualized exercise prescription for aerobic and resistive training based  on initial evaluation findings, risk stratification, comorbidities and participant's personal goals.       Expected Outcomes Short Term: Attend rehab on a regular basis to increase amount of physical activity.;Long Term: Add in home exercise to make exercise part of routine and to increase amount of physical activity.;Long Term: Exercising regularly at least 3-5  days a week.       Increase Strength and Stamina Yes       Intervention Provide advice, education, support and counseling about physical activity/exercise needs.;Develop an individualized exercise prescription for aerobic and resistive training based on initial evaluation findings, risk stratification, comorbidities and participant's personal goals.       Expected Outcomes Short Term: Increase workloads from initial exercise prescription for resistance, speed, and METs.;Short Term: Perform resistance training exercises routinely during rehab and add in resistance training at home;Long Term: Improve cardiorespiratory fitness, muscular endurance and strength as measured by increased METs and functional capacity ( )       Able to understand and use rate of perceived exertion (RPE) scale Yes       Intervention Provide education and explanation on how to use RPE scale       Expected Outcomes Short Term: Able to use RPE daily in rehab to express subjective intensity level;Long Term:  Able to use RPE to guide intensity level when exercising independently       Able to understand and use Dyspnea scale Yes       Intervention Provide education and explanation on how to use Dyspnea scale       Expected Outcomes Short Term: Able to use Dyspnea scale daily in rehab to express subjective sense of shortness of breath during exertion;Long Term: Able to use Dyspnea scale to guide intensity level when exercising independently       Knowledge and understanding of Target Heart Rate Range (THRR) Yes       Intervention Provide education and explanation of THRR  including how the numbers were predicted and where they are located for reference       Expected Outcomes Short Term: Able to state/look up THRR;Long Term: Able to use THRR to govern intensity when exercising independently;Short Term: Able to use daily as guideline for intensity in rehab       Able to check pulse independently Yes       Intervention Provide education and demonstration on how to check pulse in carotid and radial arteries.;Review the importance of being able to check your own pulse for safety during independent exercise       Expected Outcomes Long Term: Able to check pulse independently and accurately;Short Term: Able to explain why pulse checking is important during independent exercise       Understanding of Exercise Prescription Yes       Intervention Provide education, explanation, and written materials on patient's individual exercise prescription       Expected Outcomes Short Term: Able to explain program exercise prescription;Long Term: Able to explain home exercise prescription to exercise independently                Exercise Goals Re-Evaluation :  Exercise Goals Re-Evaluation     Row Name 12/25/22 1015 01/02/23 1519 01/04/23 0744 01/18/23 1432 01/31/23 1518     Exercise Goal Re-Evaluation   Exercise Goals Review Able to understand and use rate of perceived exertion (RPE) scale;Increase Physical Activity;Knowledge and understanding of Target Heart Rate Range (THRR);Understanding of Exercise Prescription;Increase Strength and Stamina;Able to understand and use Dyspnea scale;Able to check pulse independently Increase Physical Activity;Increase Strength and Stamina;Understanding of Exercise Prescription Increase Physical Activity;Increase Strength and Stamina;Understanding of Exercise Prescription Increase Physical Activity;Increase Strength and Stamina;Understanding of Exercise Prescription Increase Physical Activity;Increase Strength and Stamina;Understanding of Exercise  Prescription   Comments Reviewed RPE scale, THR and program prescription with pt today.  Pt voiced  understanding and was given a copy of goals to take home. Terresa is doing well her first couple of weeks being here. She started easing into her initial exercise prescription and is now able to complete the exercises. She has been reporting appropriate RPEs and oxygen has stayed above 88%. We will continue to monitor as she progresses in the program. Nusaiba has been doing well for the first couple of weeks at rehab.Staff will talk about home exercise next time as patient just started the program. She is not doing anything at home yet. We discussed the importance of it and to start thinking of a plan she will attempt to pursue for home exercise. She is very interested in the [place] because of the [employer]. Brochure was provided to patient. Malee is doing well in the program. She has continued to work at level 1 on the biostep and T4 nustep. We will encourage her to begin to progressively increase her workloads as she has not improved the levels on her seated machines yet. She also has consistently walked the treadmill at 1.6 mph with no incline, but would benefit from beginning to increase her walking workload as well. We will cotninue to monitor her progress. Journiee continues to do well in rehab. She increased to a 2.0 mph on the treadmill. She originally had a 0.5% incline but removed it- she could benefit from adding it back on. She also increased to level 2 on the T4 Nustep! She continues to have oxygen saturations above 88%.  RPEs are staying appropriate. We will continue to monitor.   Expected Outcomes Short: Use RPE daily to regulate intensity.  Long: Follow program prescription in THR. Short: Continue to follow initial exercise prescription Long: Build up overall strength and stamina Short: Patient to look into [employer], staff to review home exercise Long: Exercise independently at home at  appropriate prescription Short: Begin to increase workloads on treadmill and seated machines. Long: Continue to improve strength and stamina. Short: Add on incline back to treadmill Long: Continue to increase overall MET level and stamina    Row Name 02/01/23 0739 02/15/23 1454           Exercise Goal Re-Evaluation   Exercise Goals Review Increase Physical Activity;Increase Strength and Stamina;Understanding of Exercise Prescription Increase Physical Activity;Increase Strength and Stamina;Understanding of Exercise Prescription      Comments Kinzleigh states she has not done much at home and was encouraged to add on 1 day of structured exercise at home. She is planning on joining the [facility] [employer] [facility] when she graduates, possibly earlier to do more exercise with her rehab. We reviewed her THR and was encouraged to get a pulse ox to watch her O2 and HR. She plans on buying one this week. Overall, she feels so much stronger since starting the program and pleased with her results so far. Sharyn has continued to do well in rehab. She continues to walk on the treadmill at a speed of 2 mph and no incline. She increased from 2 lb to 3 lb hand weights for resistance training as well. She also has continued to work at an average overall MET level above 2 METs. We will continue to monitor her progress in the program.      Expected Outcomes Short: EP to go over home exercise plan, patient to buy pulse ox Long: Exercise independently at home at appropriate prescription Short: Continue to progressively increase treadmill workload. Long: Continue to improve strength and stamina.  Discharge Exercise Prescription (Final Exercise Prescription Changes):  Exercise Prescription Changes - 02/15/23 1400       Response to Exercise   Blood Pressure (Admit) 112/66    Blood Pressure (Exit) 102/60    Heart Rate (Admit) 70 bpm    Heart Rate (Exercise) 107 bpm    Heart Rate (Exit) 80 bpm    Oxygen Saturation (Admit)  100 %    Oxygen Saturation (Exercise) 91 %    Oxygen Saturation (Exit) 97 %    Rating of Perceived Exertion (Exercise) 13    Perceived Dyspnea (Exercise) 2    Symptoms none    Duration Continue with 30 min of aerobic exercise without signs/symptoms of physical distress.    Intensity THRR unchanged      Progression   Progression Continue to progress workloads to maintain intensity without signs/symptoms of physical distress.    Average METs 2.12      Resistance Training   Training Prescription Yes    Weight 3 lb    Reps 10-15      Interval Training   Interval Training No      Treadmill   MPH 2    Grade 0    Minutes 15    METs 2.53      NuStep   Level 2    Minutes 15    METs 2      Oxygen   Maintain Oxygen Saturation 88% or higher             Nutrition:  Target Goals: Understanding of nutrition guidelines, daily intake of sodium 1500mg , cholesterol 200mg , calories 30% from fat and 7% or less from saturated fats, daily to have 5 or more servings of fruits and vegetables.  Education: All About Nutrition: -Group instruction provided by verbal, written material, interactive activities, discussions, models, and posters to present general guidelines for heart healthy nutrition including fat, fiber, MyPlate, the role of sodium in heart healthy nutrition, utilization of the nutrition label, and utilization of this knowledge for meal planning. Follow up email sent as well. Written material given at graduation. Flowsheet Row Pulmonary Rehab from 02/15/2023 in [facility] Cardiac and Pulmonary Rehab  Date 02/15/23  Educator [facility]  Instruction Review Code 1- Verbalizes Understanding       Biometrics:  Pre Biometrics - 12/20/22 1649       Pre Biometrics   Height 5' 1.5" (1.562 m)    Weight 129 lb 12.8 oz (58.9 kg)    Waist Circumference 33 inches    Hip Circumference 40.5 inches    Waist to Hip Ratio 0.81 %    BMI (Calculated) 24.13    Single Leg Stand 5.7 seconds               Nutrition Therapy Plan and Nutrition Goals:  Nutrition Therapy & Goals - 01/11/23 1151       Nutrition Therapy   Diet Heart healthy, low Na    Protein (specify units) 60g   Pt reports this is per her nephrologist.   Fiber 20 grams    Whole Grain Foods 3 servings    Saturated Fats 12 max. grams    Fruits and Vegetables 8 servings/day    Sodium 1.5 grams      Personal Nutrition Goals   Nutrition Goal ST: consider loosening restrictions with diet to include more heart healthy fats, fruits, and whole grains in addition to lean protein and non-starchy vegetables. Reduce protein by cutting portion from 6oz to 3 oz  with meals.  LT: meet calorie and protein needs, continue with heart healthy habits already established.    Comments 82 y.o. F admitted to Pulmonary Rehab for pulmonary emphysema and bronchiectasis without complication. PMHx includes anemia, arthritis, CKD stg 3b, depression, GERD, HTN, osteoarthritis, former tobacco use. Reviewed relevant medications vit D3, synthroid, omeprazole. Reviewed most recent lab results. Lariah reports using GOLO diet to lose weight over the last 2 months - this includes a supplement she reports that she made sure her nephrologist was ok with before proceeding. Food recall: She eats eggs in the morning and for Lunch and dinner she is eating 6oz of protein for each meal like chicken and fish with non-starchy vegetables and a slice of whole grain bread with either olive oil or some butter. She also enjoys bean soup for lunch. Rebecah reports she would like to come off the diet to maintain weight, but is afraid she will gain weight. Discussed how the weight lost was likely due to low calorie diet, also discussed the amount of protein in 3oz of chicken or fish and her protein goal that she stated was from her nephrologist. Discussed updated CKD guidelines and importance of moderating protein, Reyah reports being unaware of how much protein she was eating and  would like to cut it back down. Discussed the importance of meeting protein and calorie needs as well as general heart healthy eating, pulmonary MNT, and MyPlate.      Intervention Plan   Intervention Prescribe, educate and counsel regarding individualized specific dietary modifications aiming towards targeted core components such as weight, hypertension, lipid management, diabetes, heart failure and other comorbidities.    Expected Outcomes Short Term Goal: Understand basic principles of dietary content, such as calories, fat, sodium, cholesterol and nutrients.;Short Term Goal: A plan has been developed with personal nutrition goals set during dietitian appointment.;Long Term Goal: Adherence to prescribed nutrition plan.             Nutrition Assessments:  MEDIFICTS Score Key: ?70 Need to make dietary changes  40-70 Heart Healthy Diet ? 40 Therapeutic Level Cholesterol Diet  Flowsheet Row Pulmonary Rehab from 12/20/2022 in [facility] Cardiac and Pulmonary Rehab  Picture Your Plate Total Score on Admission 74      Picture Your Plate Scores: <16 Unhealthy dietary pattern with much room for improvement. 41-50 Dietary pattern unlikely to meet recommendations for good health and room for improvement. 51-60 More healthful dietary pattern, with some room for improvement.  >60 Healthy dietary pattern, although there may be some specific behaviors that could be improved.   Nutrition Goals Re-Evaluation:  Nutrition Goals Re-Evaluation     Row Name 01/04/23 [phone] 02/01/23 0759           Goals   Nutrition Goal Patient changed her mind, originally she deferred seeing the dietician. Upon speaking with her today, patient has decided she would like to pursue a RD appointment. Rd appointment scheduled with RD for 3/7. ST: consider loosening restrictions with diet to include more heart healthy fats, fruits, and whole grains in addition to lean protein and non-starchy vegetables. Reduce protein by  cutting portion from 6oz to 3 oz with meals.  LT: meet calorie and protein needs, continue with heart healthy habits already established.      Comment -- Cythia states she is still on her [employer], and can't eat too protein. She did cut her portion sizes of her protein intake as recommended by the RD. She states her doctor told her  she can only have 60 grams of protein/day. duer to her kidneys. She is trying to maintain her weight. Some days, she feels extra hungry she will add in an extra snack to keep her feel satiated. She has snacked on walnuts and knows to honor her hunger cues with appropriate portion sizes.      Expected Outcome -- Short: Continue maintain appropriate amount of protein/ day Long: Continue to eat healthy pulmonary based diet               Nutrition Goals Discharge (Final Nutrition Goals Re-Evaluation):  Nutrition Goals Re-Evaluation - 02/01/23 0759       Goals   Nutrition Goal ST: consider loosening restrictions with diet to include more heart healthy fats, fruits, and whole grains in addition to lean protein and non-starchy vegetables. Reduce protein by cutting portion from 6oz to 3 oz with meals.  LT: meet calorie and protein needs, continue with heart healthy habits already established.    Comment Ammarie states she is still on her Golo diet, and can't eat too protein. She did cut her portion sizes of her protein intake as recommended by the RD. She states her doctor told her she can only have 60 grams of protein/day. duer to her kidneys. She is trying to maintain her weight. Some days, she feels extra hungry she will add in an extra snack to keep her feel satiated. She has snacked on walnuts and knows to honor her hunger cues with appropriate portion sizes.    Expected Outcome Short: Continue maintain appropriate amount of protein/ day Long: Continue to eat healthy pulmonary based diet             Psychosocial: Target Goals: Acknowledge presence or absence of  significant depression and/or stress, maximize coping skills, provide positive support system. Participant is able to verbalize types and ability to use techniques and skills needed for reducing stress and depression.   Education: Stress, Anxiety, and Depression - Group verbal and visual presentation to define topics covered.  Reviews how body is impacted by stress, anxiety, and depression.  Also discusses healthy ways to reduce stress and to treat/manage anxiety and depression.  Written material given at graduation. Flowsheet Row Pulmonary Rehab from 02/15/2023 in [facility] Cardiac and Pulmonary Rehab  Date 01/18/23  Educator [facility]  Instruction Review Code 1- [employer] Understanding       Education: Sleep Hygiene -Provides group verbal and written instruction about how sleep can affect your health.  Define sleep hygiene, discuss sleep cycles and impact of sleep habits. Review good sleep hygiene tips.    Initial Review & Psychosocial Screening:  Initial Psych Review & Screening - 12/14/22 1505       Initial Review   Current issues with None Identified      Family Dynamics   Good Support System? Yes   2 close friends,  boss at work     Barriers   Psychosocial barriers to participate in program There are no identifiable barriers or psychosocial needs.      Screening Interventions   Interventions Encouraged to exercise;To provide support and resources with identified psychosocial needs;Provide feedback about the scores to participant    Expected Outcomes Short Term goal: Utilizing psychosocial counselor, staff and physician to assist with identification of specific Stressors or current issues interfering with healing process. Setting desired goal for each stressor or current issue identified.;Long Term Goal: Stressors or current issues are controlled or eliminated.;Short Term goal: Identification and review with participant of  any Quality of Life or Depression concerns found by scoring the  questionnaire.;Long Term goal: The participant improves quality of Life and PHQ9 Scores as seen by post scores and/or verbalization of changes             Quality of Life Scores:  Scores of 19 and below usually indicate a poorer quality of life in these areas.  A difference of  2-3 points is a clinically meaningful difference.  A difference of 2-3 points in the total score of the Quality of Life Index has been associated with significant improvement in overall quality of life, self-image, physical symptoms, and general health in studies assessing change in quality of life.  PHQ-9: Review Flowsheet       12/20/2022 03/24/2014 03/19/2013 01/19/2013  Depression screen PHQ 2/9  Decreased Interest 0 0 0 0  Down, Depressed, Hopeless 0 0 0 0  PHQ - 2 Score 0 0 0 0  Altered sleeping 2 - - -  Tired, decreased energy 0 - - -  Change in appetite 1 - - -  Feeling bad or failure about yourself  0 - - -  Trouble concentrating 0 - - -  Moving slowly or fidgety/restless 0 - - -  Suicidal thoughts 0 - - -  PHQ-9 Score 3 - - -  Difficult doing work/chores Not difficult at all - - -   Interpretation of Total Score  Total Score Depression Severity:  1-4 = Minimal depression, 5-9 = Mild depression, 10-14 = Moderate depression, 15-19 = Moderately severe depression, 20-27 = Severe depression   Psychosocial Evaluation and Intervention:  Psychosocial Evaluation - 12/14/22 1521       Psychosocial Evaluation & Interventions   Interventions Encouraged to exercise with the program and follow exercise prescription    Comments Garrie has no barriers to attending the program. She continues to work and lives alone. She has 2 friends that are her support and her boss at work supports her too. She wants to work on improving her exercise regimen as she has never exercised on a routine basis ever.   She does have scoliosis and some arthritis. She does not feel that will hinder her exercising. She is ready to get  started.    Expected Outcomes STG Latiya attends all scheduled sessions, she is able to progress with her exercise . LTG Coraleigh continues her exercise progression after discharge             Psychosocial Re-Evaluation:  Psychosocial Re-Evaluation     Row Name 01/11/23 [phone] 02/01/23 0801           Psychosocial Re-Evaluation   Current issues with None Identified None Identified      Comments Sudiksha is doing well in rehab. She denies any major stressors.  She is still working part time.  She used to pride herself on being a great sleeper, but now a days she does not sleep as well.  She is having a rough breathing day today and balance which has her concerned today, but she has not had as much to drink. Shanaia states she no big stressors. She is still working 2 days/ week and staying busy. This last week, her sleep  has not been great, but normally she sleeps well. She feels it has been better since exericse started. She can takea acetaminophin prn if she feels she absolutely has to. She knows to contact her doctor if her sleep gets worse or does not improve. OVerall, she feels  she has a better routine with exercise, feel more confident and stronger since she first started.      Expected Outcomes Short: Continue to exercise routinely for mental boost Long: Continue to focus on positive and sleep Short: Continue good attendance with rehab Long: Continue to maintain positive attitude and utilize exercise for stress management      Interventions Encouraged to attend Pulmonary Rehabilitation for the exercise;Stress management education Encouraged to attend Pulmonary Rehabilitation for the exercise      Continue Psychosocial Services  Follow up required by staff Follow up required by staff               Psychosocial Discharge (Final Psychosocial Re-Evaluation):  Psychosocial Re-Evaluation - 02/01/23 0801       Psychosocial Re-Evaluation   Current issues with None Identified    Comments  Kevonna states she no big stressors. She is still working 2 days/ week and staying busy. This last week, her sleep  has not been great, but normally she sleeps well. She feels it has been better since exericse started. She can takea acetaminophin prn if she feels she absolutely has to. She knows to contact her doctor if her sleep gets worse or does not improve. OVerall, she feels she has a better routine with exercise, feel more confident and stronger since she first started.    Expected Outcomes Short: Continue good attendance with rehab Long: Continue to maintain positive attitude and utilize exercise for stress management    Interventions Encouraged to attend Pulmonary Rehabilitation for the exercise    Continue Psychosocial Services  Follow up required by staff             Education: Education Goals: Education classes will be provided on a weekly basis, covering required topics. Participant will state understanding/return demonstration of topics presented.  Learning Barriers/Preferences:   General Pulmonary Education Topics:  Infection Prevention: - Provides verbal and written material to individual with discussion of infection control including proper hand washing and proper equipment cleaning during exercise session. Flowsheet Row Pulmonary Rehab from 02/15/2023 in [facility] Cardiac and Pulmonary Rehab  Education need identified 12/20/22  Date 12/20/22  Educator KW  Instruction Review Code 1- Verbalizes Understanding       Falls Prevention: - Provides verbal and written material to individual with discussion of falls prevention and safety. Flowsheet Row Pulmonary Rehab from 12/14/2022 in [facility] Cardiac and Pulmonary Rehab  Date 12/14/22  Educator SB  Instruction Review Code 1- Verbalizes Understanding       Chronic Lung Disease Review: - Group verbal instruction with posters, models, PowerPoint presentations and videos,  to review new updates, new respiratory medications, new  advancements in procedures and treatments. Providing information on websites and "800" numbers for continued self-education. Includes information about supplement oxygen, available portable oxygen systems, continuous and intermittent flow rates, oxygen safety, concentrators, and Medicare reimbursement for oxygen. Explanation of Pulmonary Drugs, including class, frequency, complications, importance of spacers, rinsing mouth after steroid MDI's, and proper cleaning methods for nebulizers. Review of basic lung anatomy and physiology related to function, structure, and complications of lung disease. Review of risk factors. Discussion about methods for diagnosing sleep apnea and types of masks and machines for OSA. Includes a review of the use of types of environmental controls: home humidity, furnaces, filters, dust mite/pet prevention, HEPA vacuums. Discussion about weather changes, air quality and the benefits of nasal washing. Instruction on Warning signs, infection symptoms, calling MD promptly, preventive modes, and value of vaccinations. Review  of effective airway clearance, coughing and/or vibration techniques. Emphasizing that all should Create an Action Plan. Written material given at graduation. Flowsheet Row Pulmonary Rehab from 02/15/2023 in [facility] Cardiac and Pulmonary Rehab  Education need identified 12/20/22       AED/CPR: - Group verbal and written instruction with the use of models to demonstrate the basic use of the AED with the basic ABC's of resuscitation.    Anatomy and Cardiac Procedures: - Group verbal and visual presentation and models provide information about basic cardiac anatomy and function. Reviews the testing methods done to diagnose heart disease and the outcomes of the test results. Describes the treatment choices: Medical Management, Angioplasty, or Coronary Bypass Surgery for treating various heart conditions including Myocardial Infarction, Angina, Valve Disease, and Cardiac  Arrhythmias.  Written material given at graduation.   Medication Safety: - Group verbal and visual instruction to review commonly prescribed medications for heart and lung disease. Reviews the medication, class of the drug, and side effects. Includes the steps to properly store meds and maintain the prescription regimen.  Written material given at graduation. Flowsheet Row Pulmonary Rehab from 02/15/2023 in [facility] Cardiac and Pulmonary Rehab  Date 12/28/22  Educator [facility]  Instruction Review Code 1- Verbalizes Understanding       Other: -Provides group and verbal instruction on various topics (see comments)   Knowledge Questionnaire Score:  Knowledge Questionnaire Score - 12/20/22 1637       Knowledge Questionnaire Score   Pre Score 12/18              Core Components/Risk Factors/Patient Goals at Admission:  Personal Goals and Risk Factors at Admission - 12/20/22 1716       Core Components/Risk Factors/Patient Goals on Admission    Weight Management Yes;Weight Maintenance    Intervention Weight Management: Develop a combined nutrition and exercise program designed to reach desired caloric intake, while maintaining appropriate intake of nutrient and fiber, sodium and fats, and appropriate energy expenditure required for the weight goal.;Weight Management: Provide education and appropriate resources to help participant work on and attain dietary goals.;Weight Management/Obesity: Establish reasonable short term and long term weight goals.    Admit Weight 129 lb (58.5 kg)    Goal Weight: Short Term 129 lb (58.5 kg)    Goal Weight: Long Term 129 lb (58.5 kg)    Expected Outcomes Short Term: Continue to assess and modify interventions until short term weight is achieved;Long Term: Adherence to nutrition and physical activity/exercise program aimed toward attainment of established weight goal;Weight Maintenance: Understanding of the daily nutrition guidelines, which includes 25-35%  calories from fat, 7% or less cal from saturated fats, less than 200mg  cholesterol, less than 1.5gm of sodium, & 5 or more servings of fruits and vegetables daily;Understanding recommendations for meals to include 15-35% energy as protein, 25-35% energy from fat, 35-60% energy from carbohydrates, less than 200mg  of dietary cholesterol, 20-35 gm of total fiber daily;Understanding of distribution of calorie intake throughout the day with the consumption of 4-5 meals/snacks    Hypertension Yes   kidney disease   Intervention Provide education on lifestyle modifcations including regular physical activity/exercise, weight management, moderate sodium restriction and increased consumption of fresh fruit, vegetables, and low fat dairy, alcohol moderation, and smoking cessation.;Monitor prescription use compliance.    Expected Outcomes Short Term: Continued assessment and intervention until BP is < 140/32mm HG in hypertensive participants. < 130/47mm HG in hypertensive participants with diabetes, heart failure or chronic kidney disease.;Long Term: Maintenance of  blood pressure at goal levels.    Lipids Yes    Intervention Provide education and support for participant on nutrition & aerobic/resistive exercise along with prescribed medications to achieve LDL 70mg , HDL >40mg .    Expected Outcomes Short Term: Participant states understanding of desired cholesterol values and is compliant with medications prescribed. Participant is following exercise prescription and nutrition guidelines.;Long Term: Cholesterol controlled with medications as prescribed, with individualized exercise RX and with personalized nutrition plan. Value goals: LDL < 70mg , HDL > 40 mg.             Education:Diabetes - Individual verbal and written instruction to review signs/symptoms of diabetes, desired ranges of glucose level fasting, after meals and with exercise. Acknowledge that pre and post exercise glucose checks will be done for 3  sessions at entry of program.   Know Your Numbers and Heart Failure: - Group verbal and visual instruction to discuss disease risk factors for cardiac and pulmonary disease and treatment options.  Reviews associated critical values for Overweight/Obesity, Hypertension, Cholesterol, and Diabetes.  Discusses basics of heart failure: signs/symptoms and treatments.  Introduces Heart Failure Zone chart for action plan for heart failure.  Written material given at graduation. Flowsheet Row Pulmonary Rehab from 02/15/2023 in [facility] Cardiac and Pulmonary Rehab  Date 01/04/23  Educator [facility]  Instruction Review Code 1- Verbalizes Understanding       Core Components/Risk Factors/Patient Goals Review:   Goals and Risk Factor Review     Row Name 01/04/23 0802 01/04/23 0837 02/01/23 0740         Core Components/Risk Factors/Patient Goals Review   Personal Goals Review Weight Management/Obesity;Hypertension Weight Management/Obesity;Hypertension Weight Management/Obesity;Hypertension;Lipids     Review Wants to mainrain weight..  BP cuff-. Patient states she would like to maintain her weight. She weighs around 124 lb. We discussed importance of trying to maintain muscle mass given age. She has decided to talk to the RD and will be making an appointment with her. She does have a  BP cuff at home but admits she does not normally check it. Encouraged her to get in the habit of doing so, reviewed importance and to be aware of how she is feeling all the time. Talk to MD if there are any concerns. Patient states she brought her BP cuff to rehab yesterday and realized her cuff is off about 10 pts. She is planning on getting a new cuff as her one now is old. Her BP at rehab ranges around  110-120s/60s. At home, it reports a higher reading, though she hopes it shows a more regular BP once a new BP is purchased. She checks her weight everyday and is trying to maintain. She currently weighs 123-124 lb. She is still on the  [employer] and already made goals with the RD on some recommendations. Taking all medications appropriately.     Expected Outcomes -- Short: Start checking BP at home, keep log Long: Continue to manage lifestyle risk factors Short: Buy new BP cuff, continue to monitor weight Long: Continue to monitor lifestyle risk factors              Core Components/Risk Factors/Patient Goals at Discharge (Final Review):   Goals and Risk Factor Review - 02/01/23 0740       Core Components/Risk Factors/Patient Goals Review   Personal Goals Review Weight Management/Obesity;Hypertension;Lipids    Review Patient states she brought her BP cuff to rehab yesterday and realized her cuff is off about 10 pts. She is  planning on getting a new cuff as her one now is old. Her BP at rehab ranges around  110-120s/60s. At home, it reports a higher reading, though she hopes it shows a more regular BP once a new BP is purchased. She checks her weight everyday and is trying to maintain. She currently weighs 123-124 lb. She is still on the [employer] and already made goals with the RD on some recommendations. Taking all medications appropriately.    Expected Outcomes Short: Buy new BP cuff, continue to monitor weight Long: Continue to monitor lifestyle risk factors             ITP Comments:  ITP Comments     Row Name 12/14/22 1520 12/20/22 1639 12/25/22 1014 12/26/22 2059 12/27/22 1308   ITP Comments Virtual orientation call completed today. shehas an appointment on Date: 12/20/2022  for EP eval and gym Orientation.  Documentation of diagnosis can be found in [facility] Date: [id number] . Completed and gym orientation. Initial ITP created and sent for review to Dr. Vida Rigger, Medical Director. First full day of exercise!  Patient was oriented to gym and equipment including functions, settings, policies, and procedures.  Patient's individual exercise prescription and treatment plan were reviewed.  All starting workloads were  established based on the results of the 6 minute walk test done at initial orientation visit.  The plan for exercise progression was also introduced and progression will be customized based on patient's performance and goals. Patient requested at gym orientation about the process of DNR. RN had thorough explanation with patient on process on how to proceed as DNR under rehab. Patient was told she would need to sign a new consent with rehab in order to correct her code. She was also given the number to call to change her status in her medical chart under [place]. Until the new consent is signed, patient agrees to exercise under our consent to treat. Patient expressed understanding and agreed to start exercise. 30 day review completed. ITP sent to Dr. Jinny Sanders, Medical Director of  Pulmonary Rehab. Continue with ITP unless changes are made by physician. Pt new to program, oriented on 12/20/22.    Row Name 01/11/23 1215 01/24/23 0832 02/21/23 1200       ITP Comments Completed initial RD consultation 30 Day review completed. Medical Director ITP review done, changes made as directed, and signed approval by Medical Director. 30 day review completed. ITP sent to Dr. Jinny Sanders, Medical Director of  Pulmonary Rehab. Continue with ITP unless changes are made by physician.              Comments: 30 day review

## 2023-02-22 ENCOUNTER — Encounter: Payer: HMO | Admitting: *Deleted

## 2023-02-22 DIAGNOSIS — J439 Emphysema, unspecified: Secondary | ICD-10-CM

## 2023-02-22 DIAGNOSIS — J479 Bronchiectasis, uncomplicated: Secondary | ICD-10-CM

## 2023-02-22 NOTE — Progress Notes (Signed)
Daily Session Note  Patient Details  Name: Kristen Bartlett MRN: [id number] Date of Birth: 27-Nov-1940 Referring Provider:   Flowsheet Row Pulmonary Rehab from 12/20/2022 in [facility] Cardiac and Pulmonary Rehab  Referring Provider Rosary Lively MD       Encounter Date: 02/22/2023  Check In:  Session Check In - 02/22/23 0728       Check-In   Supervising physician immediately available to respond to emergencies See telemetry face sheet for immediately available ER MD    Location [facility]-Cardiac & Pulmonary Rehab    Staff Present Lanny Hurst, RN, ADN;Jessica Juanetta Gosling, MA, RCEP, CCRP, CCET;Joseph [place], [place]    Virtual Visit No    Medication changes reported     No    Fall or balance concerns reported    No    Warm-up and Cool-down Performed on first and last piece of equipment    Resistance Training Performed Yes    VAD Patient? No    PAD/SET Patient? No      Pain Assessment   Currently in Pain? No/denies                Social History   Tobacco Use  Smoking Status Former   Packs/day: 2.00   Years: 30.00   Additional pack years: 0.00   Total pack years: 60.00   Types: Cigarettes   Quit date: 12/09/1983   Years since quitting: 39.2  Smokeless Tobacco Never    Goals Met:  Independence with exercise equipment Exercise tolerated well No report of concerns or symptoms today Strength training completed today  Goals Unmet:  Not Applicable  Comments: Pt able to follow exercise prescription today without complaint.  Will continue to monitor for progression.    Dr. Bethann Punches is Medical Director for [facility] Cardiac Rehabilitation.  Dr. Vida Rigger is Medical Director for [facility] Pulmonary Rehabilitation.

## 2023-02-26 ENCOUNTER — Encounter: Payer: HMO | Admitting: *Deleted

## 2023-02-26 DIAGNOSIS — J439 Emphysema, unspecified: Secondary | ICD-10-CM

## 2023-02-26 DIAGNOSIS — J479 Bronchiectasis, uncomplicated: Secondary | ICD-10-CM

## 2023-02-26 NOTE — Progress Notes (Signed)
Daily Session Note  Patient Details  Name: Kristen Bartlett MRN: [id number] Date of Birth: 27-Jun-1941 Referring Provider:   Flowsheet Row Pulmonary Rehab from 12/20/2022 in [facility] Cardiac and Pulmonary Rehab  Referring Provider Rosary Lively MD       Encounter Date: 02/26/2023  Check In:  Session Check In - 02/26/23 0732       Check-In   Supervising physician immediately available to respond to emergencies See telemetry face sheet for immediately available ER MD    Location [facility]-Cardiac & Pulmonary Rehab    Staff Present Lanny Hurst, RN, Franki Monte, BS, ACSM CEP, Exercise Physiologist;Joseph Reino [place], [place]    Virtual Visit No    Medication changes reported     No    Fall or balance concerns reported    No    Warm-up and Cool-down Performed on first and last piece of equipment    Resistance Training Performed Yes    VAD Patient? No    PAD/SET Patient? No      Pain Assessment   Currently in Pain? No/denies                Social History   Tobacco Use  Smoking Status Former   Packs/day: 2.00   Years: 30.00   Additional pack years: 0.00   Total pack years: 60.00   Types: Cigarettes   Quit date: 12/09/1983   Years since quitting: 39.2  Smokeless Tobacco Never    Goals Met:  Independence with exercise equipment Exercise tolerated well No report of concerns or symptoms today Strength training completed today  Goals Unmet:  Not Applicable  Comments: Pt able to follow exercise prescription today without complaint.  Will continue to monitor for progression.    Dr. Bethann Punches is Medical Director for [facility] Cardiac Rehabilitation.  Dr. Vida Rigger is Medical Director for [facility] Pulmonary Rehabilitation.

## 2023-02-28 ENCOUNTER — Encounter: Payer: HMO | Admitting: *Deleted

## 2023-02-28 DIAGNOSIS — J479 Bronchiectasis, uncomplicated: Secondary | ICD-10-CM

## 2023-02-28 DIAGNOSIS — J439 Emphysema, unspecified: Secondary | ICD-10-CM

## 2023-02-28 NOTE — Progress Notes (Signed)
Daily Session Note  Patient Details  Name: Kristen Bartlett MRN: [id number] Date of Birth: 06-26-1941 Referring Provider:   Flowsheet Row Pulmonary Rehab from 12/20/2022 in [facility] Cardiac and Pulmonary Rehab  Referring Provider Rosary Lively MD       Encounter Date: 02/28/2023  Check In:  Session Check In - 02/28/23 0736       Check-In   Supervising physician immediately available to respond to emergencies See telemetry face sheet for immediately available ER MD    Location [facility]-Cardiac & Pulmonary Rehab    Staff Present Lanny Hurst, RN, ADN;Joseph Reino [place], RCP,RRT,BSRT;Noah Tickle, BS, Exercise Physiologist    Virtual Visit No    Medication changes reported     No    Fall or balance concerns reported    No    Warm-up and Cool-down Performed on first and last piece of equipment    Resistance Training Performed Yes    VAD Patient? No    PAD/SET Patient? No      Pain Assessment   Currently in Pain? No/denies                Social History   Tobacco Use  Smoking Status Former   Packs/day: 2.00   Years: 30.00   Additional pack years: 0.00   Total pack years: 60.00   Types: Cigarettes   Quit date: 12/09/1983   Years since quitting: 39.2  Smokeless Tobacco Never    Goals Met:  Independence with exercise equipment Exercise tolerated well No report of concerns or symptoms today Strength training completed today  Goals Unmet:  Not Applicable  Comments: Pt able to follow exercise prescription today without complaint.  Will continue to monitor for progression.    Dr. Bethann Punches is Medical Director for [facility] Cardiac Rehabilitation.  Dr. Vida Rigger is Medical Director for [facility] Pulmonary Rehabilitation.

## 2023-03-01 ENCOUNTER — Encounter: Payer: HMO | Admitting: *Deleted

## 2023-03-01 VITALS — Ht 61.5 in | Wt 124.1 lb

## 2023-03-01 DIAGNOSIS — J479 Bronchiectasis, uncomplicated: Secondary | ICD-10-CM

## 2023-03-01 DIAGNOSIS — J439 Emphysema, unspecified: Secondary | ICD-10-CM | POA: Diagnosis not present

## 2023-03-01 NOTE — Progress Notes (Signed)
Daily Session Note  Patient Details  Name: Kristen Bartlett MRN: [id number] Date of Birth: 08-18-1941 Referring Provider:   Flowsheet Row Pulmonary Rehab from 12/20/2022 in [facility] Cardiac and Pulmonary Rehab  Referring Provider Rosary Lively MD       Encounter Date: 03/01/2023  Check In:  Session Check In - 03/01/23 0727       Check-In   Supervising physician immediately available to respond to emergencies See telemetry face sheet for immediately available ER MD    Location [facility]-Cardiac & Pulmonary Rehab    Staff Present Lanny Hurst, RN, ADN;Denise Rhew, PhD, RN, CNS, CEN;Jessica Juanetta Gosling, MA, RCEP, CCRP, CCET    Virtual Visit No    Medication changes reported     No    Fall or balance concerns reported    No    Warm-up and Cool-down Performed on first and last piece of equipment    Resistance Training Performed Yes    VAD Patient? No    PAD/SET Patient? No      Pain Assessment   Currently in Pain? No/denies                Social History   Tobacco Use  Smoking Status Former   Packs/day: 2.00   Years: 30.00   Additional pack years: 0.00   Total pack years: 60.00   Types: Cigarettes   Quit date: 12/09/1983   Years since quitting: 39.2  Smokeless Tobacco Never    Goals Met:  Independence with exercise equipment Exercise tolerated well No report of concerns or symptoms today Strength training completed today  Goals Unmet:  Not Applicable  Comments: Pt able to follow exercise prescription today without complaint.  Will continue to monitor for progression.   6 Minute Walk     Row Name 12/20/22 1649 03/01/23 0731       6 Minute Walk   Phase Initial Discharge    Distance 1130 feet 1420 feet    Distance % Change -- 25.7 %    Distance Feet Change -- 290 ft    Walk Time 6 minutes 6 minutes    # of Rest Breaks 0 0    MPH 2.14 2.69    METS 1.83 2.47    RPE 13 13    Perceived Dyspnea  1 1    VO2 Peak 6.41 8.66    Symptoms Yes (comment) Yes (comment)     Comments Fatigue legs fatigued    Resting HR 71 bpm 68 bpm    Resting BP 110/62 112/60    Resting Oxygen Saturation  98 % 98 %    Exercise Oxygen Saturation  during 6 min walk 97 % 97 %    Max Ex. HR 93 bpm 102 bpm    Max Ex. BP 124/62 124/62    2 Minute Post BP 112/62 118/60      Interval HR   1 Minute HR 84 82    2 Minute HR 90 93    3 Minute HR 93 96    4 Minute HR 91 96    5 Minute HR 92 102    6 Minute HR 89 102    2 Minute Post HR 69 85    Interval Heart Rate? Yes Yes      Interval Oxygen   Interval Oxygen? Yes Yes    Baseline Oxygen Saturation % 98 % 98 %    1 Minute Oxygen Saturation % 98 % 98 %    1 Minute  Liters of Oxygen 0 L  RA 0 L  Room Air    2 Minute Oxygen Saturation % 97 % 97 %    2 Minute Liters of Oxygen 0 L 0 L    3 Minute Oxygen Saturation % 98 % 98 %    3 Minute Liters of Oxygen 0 L 0 L    4 Minute Oxygen Saturation % 99 % 97 %    4 Minute Liters of Oxygen 0 L 0 L    5 Minute Oxygen Saturation % 98 % 98 %    5 Minute Liters of Oxygen 0 L 0 L    6 Minute Oxygen Saturation % 99 % 99 %    6 Minute Liters of Oxygen 0 L 0 L    2 Minute Post Oxygen Saturation % 98 % 98 %    2 Minute Post Liters of Oxygen 0 L 0 L              Dr. Bethann Punches is Medical Director for [facility] Cardiac Rehabilitation.  Dr. Vida Rigger is Medical Director for [facility] Pulmonary Rehabilitation.

## 2023-03-01 NOTE — Patient Instructions (Signed)
Discharge Patient Instructions  Patient Details  Name: Kristen Bartlett MRN: [id number] Date of Birth: Jul 05, 1941 Referring Provider:  Rosemarie Ax, Bartlett   Number of Visits: 11  Reason for Discharge:  Patient reached a stable level of exercise. Patient independent in their exercise. Patient has met program and personal goals.  Smoking History:  Social History   Tobacco Use  Smoking Status Former   Packs/day: 2.00   Years: 30.00   Additional pack years: 0.00   Total pack years: 60.00   Types: Cigarettes   Quit date: 12/09/1983   Years since quitting: 39.2  Smokeless Tobacco Never    Diagnosis:  Pulmonary emphysema, unspecified emphysema type  Bronchiectasis without complication  Initial Exercise Prescription:  Initial Exercise Prescription - 12/20/22 1700       Date of Initial Exercise RX and Referring Provider   Date 12/20/22    Referring Provider Kristen Bartlett, Kristen Bartlett      Oxygen   Maintain Oxygen Saturation 88% or higher      Treadmill   MPH 1.6    Grade 0    Minutes 15    METs 2.23      NuStep   Level 1    SPM 80    Minutes 15    METs 1.8      Biostep-RELP   Level 1    SPM 50    Minutes 15    METs 1.8      Track   Laps 20    Minutes 15    METs 2.09      Prescription Details   Frequency (times per week) 3    Duration Progress to 30 minutes of continuous aerobic without signs/symptoms of physical distress      Intensity   THRR 40-80% of Max Heartrate 97 - 123    Ratings of Perceived Exertion 11-13    Perceived Dyspnea 0-4      Progression   Progression Continue to progress workloads to maintain intensity without signs/symptoms of physical distress.      Resistance Training   Training Prescription Yes    Weight 2 lb    Reps 10-15             Discharge Exercise Prescription (Final Exercise Prescription Changes):  Exercise Prescription Changes - 02/28/23 1400       Response to Exercise   Blood Pressure (Admit) 114/56    Blood  Pressure (Exit) 118/62    Heart Rate (Admit) 74 bpm    Heart Rate (Exercise) 88 bpm    Heart Rate (Exit) 74 bpm    Oxygen Saturation (Admit) 98 %    Oxygen Saturation (Exercise) 94 %    Oxygen Saturation (Exit) 96 %    Rating of Perceived Exertion (Exercise) 13    Perceived Dyspnea (Exercise) 1    Symptoms SOB    Duration Continue with 30 min of aerobic exercise without signs/symptoms of physical distress.    Intensity THRR unchanged      Progression   Progression Continue to progress workloads to maintain intensity without signs/symptoms of physical distress.    Average METs 2.21      Resistance Training   Training Prescription Yes    Weight 2 lb    Reps 10-15      Interval Training   Interval Training No      Treadmill   MPH 2.1    Grade 0    Minutes 15    METs 2.61  NuStep   Level 2    Minutes 15    METs 2      Oxygen   Maintain Oxygen Saturation 88% or higher             Functional Capacity:  6 Minute Walk     Row Name 12/20/22 1649 03/01/23 0731       6 Minute Walk   Phase Initial Discharge    Distance 1130 feet 1420 feet    Distance % Change -- 25.7 %    Distance Feet Change -- 290 ft    Walk Time 6 minutes 6 minutes    # of Rest Breaks 0 0    MPH 2.14 2.69    METS 1.83 2.47    RPE 13 13    Perceived Dyspnea  1 1    VO2 Peak 6.41 8.66    Symptoms Yes (comment) Yes (comment)    Comments Fatigue legs fatigued    Resting HR 71 bpm 68 bpm    Resting BP 110/62 112/60    Resting Oxygen Saturation  98 % 98 %    Exercise Oxygen Saturation  during 6 min walk 97 % 97 %    Max Ex. HR 93 bpm 102 bpm    Max Ex. BP 124/62 124/62    2 Minute Post BP 112/62 118/60      Interval HR   1 Minute HR 84 82    2 Minute HR 90 93    3 Minute HR 93 96    4 Minute HR 91 96    5 Minute HR 92 102    6 Minute HR 89 102    2 Minute Post HR 69 85    Interval Heart Rate? Yes Yes      Interval Oxygen   Interval Oxygen? Yes Yes    Baseline Oxygen Saturation  % 98 % 98 %    1 Minute Oxygen Saturation % 98 % 98 %    1 Minute Liters of Oxygen 0 L  RA 0 L  Room Air    2 Minute Oxygen Saturation % 97 % 97 %    2 Minute Liters of Oxygen 0 L 0 L    3 Minute Oxygen Saturation % 98 % 98 %    3 Minute Liters of Oxygen 0 L 0 L    4 Minute Oxygen Saturation % 99 % 97 %    4 Minute Liters of Oxygen 0 L 0 L    5 Minute Oxygen Saturation % 98 % 98 %    5 Minute Liters of Oxygen 0 L 0 L    6 Minute Oxygen Saturation % 99 % 99 %    6 Minute Liters of Oxygen 0 L 0 L    2 Minute Post Oxygen Saturation % 98 % 98 %    2 Minute Post Liters of Oxygen 0 L 0 L            Nutrition & Weight - Outcomes:  Pre Biometrics - 12/20/22 1649       Pre Biometrics   Height 5' 1.5" (1.562 m)    Weight 129 lb 12.8 oz (58.9 kg)    Waist Circumference 33 inches    Hip Circumference 40.5 inches    Waist to Hip Ratio 0.81 %    BMI (Calculated) 24.13    Single Leg Stand 5.7 seconds             Post  Biometrics - 03/01/23 0734        Post  Biometrics   Height 5' 1.5" (1.562 m)    Weight 124 lb 1.6 oz (56.3 kg)    Waist Circumference 28.5 inches    Hip Circumference 31 inches    Waist to Hip Ratio 0.92 %    BMI (Calculated) 23.07    Single Leg Stand 16.9 seconds             Nutrition:  Nutrition Therapy & Goals - 01/11/23 1151       Nutrition Therapy   Diet Heart healthy, low Na    Protein (specify units) 60g   Pt reports this is per her nephrologist.   Fiber 20 grams    Whole Grain Foods 3 servings    Saturated Fats 12 max. grams    Fruits and Vegetables 8 servings/day    Sodium 1.5 grams      Personal Nutrition Goals   Nutrition Goal ST: consider loosening restrictions with diet to include more heart healthy fats, fruits, and whole grains in addition to lean protein and non-starchy vegetables. Reduce protein by cutting portion from 6oz to 3 oz with meals.  LT: meet calorie and protein needs, continue with heart healthy habits already  established.    Comments 82 y.o. F admitted to Pulmonary Rehab for pulmonary emphysema and bronchiectasis without complication. PMHx includes anemia, arthritis, CKD stg 3b, depression, GERD, HTN, osteoarthritis, former tobacco use. Reviewed relevant medications vit D3, synthroid, omeprazole. Reviewed most recent lab results. Kristen Bartlett reports using GOLO diet to lose weight over the last 2 months - this includes a supplement she reports that she made sure her nephrologist was ok with before proceeding. Food recall: She eats eggs in the morning and for Lunch and dinner she is eating 6oz of protein for each meal like chicken and fish with non-starchy vegetables and a slice of whole grain bread with either olive oil or some butter. She also enjoys bean soup for lunch. Kristen Bartlett reports she would like to come off the diet to maintain weight, but is afraid she will gain weight. Discussed how the weight lost was likely due to low calorie diet, also discussed the amount of protein in 3oz of chicken or fish and her protein goal that she stated was from her nephrologist. Discussed updated CKD guidelines and importance of moderating protein, Kristen Bartlett reports being unaware of how much protein she was eating and would like to cut it back down. Discussed the importance of meeting protein and calorie needs as well as general heart healthy eating, pulmonary MNT, and MyPlate.      Intervention Plan   Intervention Prescribe, educate and counsel regarding individualized specific dietary modifications aiming towards targeted core components such as weight, hypertension, lipid management, diabetes, heart failure and other comorbidities.    Expected Outcomes Short Term Goal: Understand basic principles of dietary content, such as calories, fat, sodium, cholesterol and nutrients.;Short Term Goal: A plan has been developed with personal nutrition goals set during dietitian appointment.;Long Term Goal: Adherence to prescribed nutrition plan.            Goals reviewed with patient; copy given to patient.

## 2023-03-05 ENCOUNTER — Encounter: Payer: HMO | Admitting: *Deleted

## 2023-03-05 DIAGNOSIS — J479 Bronchiectasis, uncomplicated: Secondary | ICD-10-CM

## 2023-03-05 DIAGNOSIS — J439 Emphysema, unspecified: Secondary | ICD-10-CM | POA: Diagnosis not present

## 2023-03-05 NOTE — Progress Notes (Signed)
Daily Session Note  Patient Details  Name: Kristen Bartlett MRN: [id number] Date of Birth: Aug 09, 1941 Referring Provider:   Flowsheet Row Pulmonary Rehab from 12/20/2022 in [facility] Cardiac and Pulmonary Rehab  Referring Provider Rosary Lively MD       Encounter Date: 03/05/2023  Check In:  Session Check In - 03/05/23 0727       Check-In   Supervising physician immediately available to respond to emergencies See telemetry face sheet for immediately available ER MD    Location [facility]-Cardiac & Pulmonary Rehab    Staff Present Lanny Hurst, RN, Franki Monte, BS, ACSM CEP, Exercise Physiologist;Joseph Reino [place], [place]    Virtual Visit No    Medication changes reported     No    Fall or balance concerns reported    No    Warm-up and Cool-down Performed on first and last piece of equipment    Resistance Training Performed Yes    VAD Patient? No    PAD/SET Patient? No      Pain Assessment   Currently in Pain? No/denies                Social History   Tobacco Use  Smoking Status Former   Packs/day: 2.00   Years: 30.00   Additional pack years: 0.00   Total pack years: 60.00   Types: Cigarettes   Quit date: 12/09/1983   Years since quitting: 39.2  Smokeless Tobacco Never    Goals Met:  Independence with exercise equipment Exercise tolerated well No report of concerns or symptoms today Strength training completed today  Goals Unmet:  Not Applicable  Comments: Pt able to follow exercise prescription today without complaint.  Will continue to monitor for progression.    Dr. Bethann Punches is Medical Director for [facility] Cardiac Rehabilitation.  Dr. Vida Rigger is Medical Director for [facility] Pulmonary Rehabilitation.

## 2023-03-07 ENCOUNTER — Encounter: Payer: HMO | Attending: Internal Medicine | Admitting: *Deleted

## 2023-03-07 DIAGNOSIS — J479 Bronchiectasis, uncomplicated: Secondary | ICD-10-CM

## 2023-03-07 DIAGNOSIS — J439 Emphysema, unspecified: Secondary | ICD-10-CM | POA: Insufficient documentation

## 2023-03-07 NOTE — Progress Notes (Signed)
Daily Session Note  Patient Details  Name: Kristen Bartlett MRN: [id number] Date of Birth: 1940/12/22 Referring Provider:   Flowsheet Row Pulmonary Rehab from 12/20/2022 in [facility] Cardiac and Pulmonary Rehab  Referring Provider Rosary Lively MD       Encounter Date: 03/07/2023  Check In:  Session Check In - 03/07/23 0809       Check-In   Supervising physician immediately available to respond to emergencies See telemetry face sheet for immediately available ER MD    Location [facility]-Cardiac & Pulmonary Rehab    Staff Present Cora Collum, RN, BSN, CCRP;Jessica [place], MA, RCEP, CCRP, CCET;Noah Tickle, BS, Exercise Physiologist;Melissa [place] MS, RDN, LDN    Virtual Visit No    Medication changes reported     No    Fall or balance concerns reported    No    Warm-up and Cool-down Performed on first and last piece of equipment    Resistance Training Performed Yes    VAD Patient? No    PAD/SET Patient? No      Pain Assessment   Currently in Pain? No/denies                Social History   Tobacco Use  Smoking Status Former   Packs/day: 2.00   Years: 30.00   Additional pack years: 0.00   Total pack years: 60.00   Types: Cigarettes   Quit date: 12/09/1983   Years since quitting: 39.2  Smokeless Tobacco Never    Goals Met:  Independence with exercise equipment Exercise tolerated well No report of concerns or symptoms today  Goals Unmet:  Not Applicable  Comments: Pt able to follow exercise prescription today without complaint.  Will continue to monitor for progression.    Dr. Bethann Punches is Medical Director for [facility] Cardiac Rehabilitation.  Dr. Vida Rigger is Medical Director for [facility] Pulmonary Rehabilitation.

## 2023-03-08 ENCOUNTER — Encounter: Payer: HMO | Admitting: *Deleted

## 2023-03-08 DIAGNOSIS — J479 Bronchiectasis, uncomplicated: Secondary | ICD-10-CM

## 2023-03-08 DIAGNOSIS — J439 Emphysema, unspecified: Secondary | ICD-10-CM

## 2023-03-08 NOTE — Progress Notes (Signed)
Daily Session Note  Patient Details  Name: Kristen Bartlett MRN: [id number] Date of Birth: [birth year] Referring Provider:   Flowsheet Row Pulmonary Rehab from 12/20/2022 in [facility] Cardiac and Pulmonary Rehab  Referring Provider Rosary Lively MD       Encounter Date: 03/08/2023  Check In:  Session Check In - 03/08/23 0729       Check-In   Supervising physician immediately available to respond to emergencies See telemetry face sheet for immediately available ER MD    Location [facility]-Cardiac & Pulmonary Rehab    Staff Present Cora Collum, RN, BSN, CCRP;Laureen Manson Passey, BS, RRT, CPFT;Kara Clinton Sawyer, MS, ACSM CEP, Exercise Physiologist    Virtual Visit No    Medication changes reported     No    Fall or balance concerns reported    No    Warm-up and Cool-down Performed on first and last piece of equipment    Resistance Training Performed Yes    VAD Patient? No    PAD/SET Patient? No      Pain Assessment   Currently in Pain? No/denies                Social History   Tobacco Use  Smoking Status Former   Packs/day: 2.00   Years: 30.00   Additional pack years: 0.00   Total pack years: 60.00   Types: Cigarettes   Quit date: 12/09/1983   Years since quitting: 39.2  Smokeless Tobacco Never    Goals Met:  Proper associated with RPD/PD & O2 Sat Independence with exercise equipment Exercise tolerated well No report of concerns or symptoms today  Goals Unmet:  Not Applicable  Comments: Pt able to follow exercise prescription today without complaint.  Will continue to monitor for progression.    Dr. Bethann Punches is Medical Director for [facility] Cardiac Rehabilitation.  Dr. Vida Rigger is Medical Director for [facility] Pulmonary Rehabilitation.

## 2023-03-12 ENCOUNTER — Encounter: Payer: HMO | Admitting: *Deleted

## 2023-03-12 DIAGNOSIS — J439 Emphysema, unspecified: Secondary | ICD-10-CM

## 2023-03-12 DIAGNOSIS — J479 Bronchiectasis, uncomplicated: Secondary | ICD-10-CM

## 2023-03-12 NOTE — Progress Notes (Signed)
Daily Session Note  Patient Details  Name: Kristen Bartlett MRN: [id number] Date of Birth: 01/26/1941 Referring Provider:   Flowsheet Row Pulmonary Rehab from 12/20/2022 in [facility] Cardiac and Pulmonary Rehab  Referring Provider Rosary Lively MD       Encounter Date: 03/12/2023  Check In:  Session Check In - 03/12/23 0728       Check-In   Supervising physician immediately available to respond to emergencies See telemetry face sheet for immediately available ER MD    Location [facility]-Cardiac & Pulmonary Rehab    Staff Present Lanny Hurst, RN, Franki Monte, BS, ACSM CEP, Exercise Physiologist;Joseph Reino [place], [place]    Virtual Visit No    Medication changes reported     No    Fall or balance concerns reported    No    Warm-up and Cool-down Performed on first and last piece of equipment    Resistance Training Performed Yes    VAD Patient? No    PAD/SET Patient? No      Pain Assessment   Currently in Pain? No/denies                Social History   Tobacco Use  Smoking Status Former   Packs/day: 2.00   Years: 30.00   Additional pack years: 0.00   Total pack years: 60.00   Types: Cigarettes   Quit date: 12/09/1983   Years since quitting: 39.2  Smokeless Tobacco Never    Goals Met:  Independence with exercise equipment Exercise tolerated well No report of concerns or symptoms today Strength training completed today  Goals Unmet:  Not Applicable  Comments: Pt able to follow exercise prescription today without complaint.  Will continue to monitor for progression.    Dr. Bethann Punches is Medical Director for [facility] Cardiac Rehabilitation.  Dr. Vida Rigger is Medical Director for [facility] Pulmonary Rehabilitation.

## 2023-03-14 ENCOUNTER — Encounter: Payer: HMO | Admitting: *Deleted

## 2023-03-14 DIAGNOSIS — J479 Bronchiectasis, uncomplicated: Secondary | ICD-10-CM

## 2023-03-14 DIAGNOSIS — J439 Emphysema, unspecified: Secondary | ICD-10-CM | POA: Diagnosis not present

## 2023-03-14 NOTE — Progress Notes (Signed)
Pulmonary Individual Treatment Plan  Patient Details  Name: Kristen Bartlett MRN: [id number] Date of Birth: 16-Feb-1941 Referring Provider:   Flowsheet Row Pulmonary Rehab from 12/20/2022 in [facility] Cardiac and Pulmonary Rehab  Referring Provider Regino Schultze, Tai MD       Initial Encounter Date:  Flowsheet Row Pulmonary Rehab from 12/20/2022 in [facility] Cardiac and Pulmonary Rehab  Date 12/20/22       Visit Diagnosis: Pulmonary emphysema, unspecified emphysema type (HCC)  Bronchiectasis without complication (HCC)  Patient's Home Medications on Admission:  Current Outpatient Medications:    acetaminophen (TYLENOL) 500 MG tablet, Take 1,500 mg by mouth every 6 (six) hours as needed for moderate pain., Disp: , Rfl:    acyclovir (ZOVIRAX) 200 MG capsule, Take 1 capsule (200 mg total) by mouth daily. (Patient not taking: Reported on 12/14/2022), Disp: 90 capsule, Rfl: 1   amLODipine (NORVASC) 10 MG tablet, Take 10 mg by mouth daily., Disp: , Rfl:    aspirin 81 MG tablet, Take 81 mg by mouth daily. (Patient not taking: Reported on 12/14/2022), Disp: , Rfl:    Calcium Carb-Cholecalciferol (CALCIUM + D3 PO), Take 600 mg by mouth 2 (two) times daily. (Patient not taking: Reported on 12/14/2022), Disp: , Rfl:    ezetimibe (ZETIA) 10 MG tablet, Take 1 tablet by mouth daily., Disp: , Rfl:    famotidine (PEPCID) 20 MG tablet, Take 20 mg by mouth 2 (two) times daily., Disp: , Rfl:    furosemide (LASIX) 20 MG tablet, Take 20 mg by mouth daily as needed., Disp: , Rfl:    levothyroxine (SYNTHROID, LEVOTHROID) 50 MCG tablet, Take 1 tablet (50 mcg total) by mouth daily., Disp: 90 tablet, Rfl: 1   losartan (COZAAR) 50 MG tablet, Take 50 mg by mouth daily., Disp: , Rfl:    Multiple Vitamin (MULTIVITAMIN) tablet, Take 1 tablet by mouth daily. (Patient not taking: Reported on 12/14/2022), Disp: , Rfl:    pantoprazole (PROTONIX) 40 MG tablet, Take 40 mg by mouth daily. (Patient not taking: Reported on 12/14/2022), Disp: , Rfl:     ranitidine (ZANTAC) 150 MG capsule, Take 150 mg by mouth daily. (Patient not taking: Reported on 12/14/2022), Disp: , Rfl:   Past Medical History: Past Medical History:  Diagnosis Date   Arthritis    GERD (gastroesophageal reflux disease)    Heart murmur    Hypercholesterolemia    Hypertension    Hypothyroidism    Syncope and collapse    Thin basement membrane disease    Thin basement membrane disease    Thin basement membrane disease     Tobacco Use: Social History   Tobacco Use  Smoking Status Former   Packs/day: 2.00   Years: 30.00   Additional pack years: 0.00   Total pack years: 60.00   Types: Cigarettes   Quit date: 12/09/1983   Years since quitting: 39.2  Smokeless Tobacco Never    Labs: Review Flowsheet       Latest Ref Rng & Units 07/28/2014  Labs for ITP Cardiac and Pulmonary Rehab  Cholestrol 0 - 200 mg/dL 782      LDL (calc) mg/dL 956      HDL-C 35 - 70 mg/dL 75      Trlycerides 40 - 160 mg/dL 76        Details       This result is from an external source.          Pulmonary Assessment Scores:  Pulmonary Assessment Scores  Row Name 12/20/22 1640 03/05/23 1000       ADL UCSD   ADL Phase Entry --    SOB Score total 17 9    Rest 0 0    Walk 0 0    Stairs 3 2    Bath 0 0    Dress 0 0    Shop 1 0      CAT Score   CAT Score 18 8      mMRC Score   mMRC Score 1 --             UCSD: Self-administered rating of dyspnea associated with activities of daily living (ADLs) 6-point scale (0 = "not at all" to 5 = "maximal or unable to do because of breathlessness")  Scoring Scores range from 0 to 120.  Minimally important difference is 5 units  CAT: CAT can identify the health impairment of COPD patients and is better correlated with disease progression.  CAT has a scoring range of zero to 40. The CAT score is classified into four groups of low (less than 10), medium (10 - 20), high (21-30) and very high (31-40) based on the impact level  of disease on health status. A CAT score over 10 suggests significant symptoms.  A worsening CAT score could be explained by an exacerbation, poor medication adherence, poor inhaler technique, or progression of COPD or comorbid conditions.  CAT MCID is 2 points  mMRC: mMRC (Modified Medical Research Council) Dyspnea Scale is used to assess the degree of baseline functional disability in patients of respiratory disease due to dyspnea. No minimal important difference is established. A decrease in score of 1 point or greater is considered a positive change.   Pulmonary Function Assessment:   Exercise Target Goals: Exercise Program Goal: Individual exercise prescription set using results from initial 6 min walk test and THRR while considering  patient's activity barriers and safety.   Exercise Prescription Goal: Initial exercise prescription builds to 30-45 minutes a day of aerobic activity, 2-3 days per week.  Home exercise guidelines will be given to patient during program as part of exercise prescription that the participant will acknowledge.  Education: Aerobic Exercise: - Group verbal and visual presentation on the components of exercise prescription. Introduces F.I.T.T principle from ACSM for exercise prescriptions.  Reviews F.I.T.T. principles of aerobic exercise including progression. Written material given at graduation. Flowsheet Row Pulmonary Rehab from 03/01/2023 in [facility] Cardiac and Pulmonary Rehab  Education need identified 12/20/22  Date 02/01/23  Educator [facility]  Instruction Review Code 1- Verbalizes Understanding       Education: Resistance Exercise: - Group verbal and visual presentation on the components of exercise prescription. Introduces F.I.T.T principle from ACSM for exercise prescriptions  Reviews F.I.T.T. principles of resistance exercise including progression. Written material given at graduation. Flowsheet Row Pulmonary Rehab from 03/01/2023 in [facility] Cardiac and  Pulmonary Rehab  Date 02/08/23  Educator NT  Instruction Review Code 1- Verbalizes Understanding        Education: Exercise & Equipment Safety: - Individual verbal instruction and demonstration of equipment use and safety with use of the equipment. Flowsheet Row Pulmonary Rehab from 03/01/2023 in [facility] Cardiac and Pulmonary Rehab  Education need identified 12/20/22  Date 12/20/22  Educator KW  Instruction Review Code 1- Verbalizes Understanding       Education: Exercise Physiology & General Exercise Guidelines: - Group verbal and written instruction with models to review the exercise physiology of the cardiovascular system and associated critical values.  Provides general exercise guidelines with specific guidelines to those with heart or lung disease.  Flowsheet Row Pulmonary Rehab from 03/01/2023 in [facility] Cardiac and Pulmonary Rehab  Education need identified 12/20/22  Date 01/25/23  Educator [facility]  Instruction Review Code 1- [employer] Understanding       Education: Flexibility, Balance, Mind/Body Relaxation: - Group verbal and visual presentation with interactive activity on the components of exercise prescription. Introduces F.I.T.T principle from ACSM for exercise prescriptions. Reviews F.I.T.T. principles of flexibility and balance exercise training including progression. Also discusses the mind body connection.  Reviews various relaxation techniques to help reduce and manage stress (i.e. Deep breathing, progressive muscle relaxation, and visualization). Balance handout provided to take home. Written material given at graduation. Flowsheet Row Pulmonary Rehab from 03/01/2023 in [facility] Cardiac and Pulmonary Rehab  Date 02/08/23  Educator NT  Instruction Review Code 1- Verbalizes Understanding       Activity Barriers & Risk Stratification:  Activity Barriers & Cardiac Risk Stratification - 12/20/22 1647       Activity Barriers & Cardiac Risk Stratification   Activity Barriers  Arthritis;Other (comment);Deconditioning;Muscular Weakness    Comments Scoliosis and arthiritis of spine             6 Minute Walk:  6 Minute Walk     Row Name 12/20/22 1649 03/01/23 0731       6 Minute Walk   Phase Initial Discharge    Distance 1130 feet 1420 feet    Distance % Change -- 25.7 %    Distance Feet Change -- 290 ft    Walk Time 6 minutes 6 minutes    # of Rest Breaks 0 0    MPH 2.14 2.69    METS 1.83 2.47    RPE 13 13    Perceived Dyspnea  1 1    VO2 Peak 6.41 8.66    Symptoms Yes (comment) Yes (comment)    Comments Fatigue legs fatigued    Resting HR 71 bpm 68 bpm    Resting BP 110/62 112/60    Resting Oxygen Saturation  98 % 98 %    Exercise Oxygen Saturation  during 6 min walk 97 % 97 %    Max Ex. HR 93 bpm 102 bpm    Max Ex. BP 124/62 124/62    2 Minute Post BP 112/62 118/60      Interval HR   1 Minute HR 84 82    2 Minute HR 90 93    3 Minute HR 93 96    4 Minute HR 91 96    5 Minute HR 92 102    6 Minute HR 89 102    2 Minute Post HR 69 85    Interval Heart Rate? Yes Yes      Interval Oxygen   Interval Oxygen? Yes Yes    Baseline Oxygen Saturation % 98 % 98 %    1 Minute Oxygen Saturation % 98 % 98 %    1 Minute Liters of Oxygen 0 L  RA 0 L  Room Air    2 Minute Oxygen Saturation % 97 % 97 %    2 Minute Liters of Oxygen 0 L 0 L    3 Minute Oxygen Saturation % 98 % 98 %    3 Minute Liters of Oxygen 0 L 0 L    4 Minute Oxygen Saturation % 99 % 97 %    4 Minute Liters of Oxygen 0 L 0  L    5 Minute Oxygen Saturation % 98 % 98 %    5 Minute Liters of Oxygen 0 L 0 L    6 Minute Oxygen Saturation % 99 % 99 %    6 Minute Liters of Oxygen 0 L 0 L    2 Minute Post Oxygen Saturation % 98 % 98 %    2 Minute Post Liters of Oxygen 0 L 0 L            Oxygen Initial Assessment:  Oxygen Initial Assessment - 02/01/23 0804       Home Oxygen   Home Oxygen Device None    Sleep Oxygen Prescription None    Home Exercise Oxygen Prescription  None    Home Resting Oxygen Prescription None      Initial 6 min Walk   Oxygen Used None      Program Oxygen Prescription   Program Oxygen Prescription None      Intervention   Short Term Goals To learn and understand importance of monitoring SPO2 with pulse oximeter and demonstrate accurate use of the pulse oximeter.;To learn and understand importance of maintaining oxygen saturations>88%;To learn and demonstrate proper pursed lip breathing techniques or other breathing techniques.     Long  Term Goals Maintenance of O2 saturations>88%;Exhibits proper breathing techniques, such as pursed lip breathing or other method taught during program session;Verbalizes importance of monitoring SPO2 with pulse oximeter and return demonstration             Oxygen Re-Evaluation:  Oxygen Re-Evaluation     Row Name 12/25/22 1015 01/11/23 0731 02/01/23 0804 03/01/23 0759       Program Oxygen Prescription   Program Oxygen Prescription -- None -- None      Home Oxygen   Home Oxygen Device -- None -- None    Sleep Oxygen Prescription -- None -- None    Home Exercise Oxygen Prescription -- None -- None    Home Resting Oxygen Prescription -- None -- None      Goals/Expected Outcomes   Short Term Goals -- To learn and understand importance of monitoring SPO2 with pulse oximeter and demonstrate accurate use of the pulse oximeter.;To learn and understand importance of maintaining oxygen saturations>88%;To learn and demonstrate proper pursed lip breathing techniques or other breathing techniques.  -- To learn and understand importance of monitoring SPO2 with pulse oximeter and demonstrate accurate use of the pulse oximeter.;To learn and understand importance of maintaining oxygen saturations>88%;To learn and demonstrate proper pursed lip breathing techniques or other breathing techniques.     Long  Term Goals -- Maintenance of O2 saturations>88%;Exhibits proper breathing techniques, such as pursed lip  breathing or other method taught during program session;Verbalizes importance of monitoring SPO2 with pulse oximeter and return demonstration -- --    Comments Reviewed PLB technique with pt.  Talked about how it works and it's importance in maintaining their exercise saturations. Mirenda is doing well in rehab.  She is not having the best day today feeling wise.  She is using her PLB routinely.  She has a pulse oximeter and will bring it in to test again what we are getting in class. Patient states she has not felt a huge difference in her breathing but can tell it has not gotten worse. Overall, she feels more conditioned and more endurance. She does not have a pulse ox but plans on purchasing one so she can start to monitor her O2 and HR at home. Her  O2 stays well above 88% at rehab, she is aware she needs to stay above that. She does practice PLB when she feels she needs to. Shermika is nearing graduation.  She continues to montior her oxygen levels and maintain her saturations    Goals/Expected Outcomes Short: Become more profiecient at using PLB.   Long: Become independent at using PLB. Short: Bring in pulse oximeter Long: Continue to use PLB Short: Buy pulse ox, start watching O2 at home Long: Continue to become proficient at using PLB long-term Continue to manage pulmonary disease             Oxygen Discharge (Final Oxygen Re-Evaluation):  Oxygen Re-Evaluation - 03/01/23 0759       Program Oxygen Prescription   Program Oxygen Prescription None      Home Oxygen   Home Oxygen Device None    Sleep Oxygen Prescription None    Home Exercise Oxygen Prescription None    Home Resting Oxygen Prescription None      Goals/Expected Outcomes   Short Term Goals To learn and understand importance of monitoring SPO2 with pulse oximeter and demonstrate accurate use of the pulse oximeter.;To learn and understand importance of maintaining oxygen saturations>88%;To learn and demonstrate proper pursed lip  breathing techniques or other breathing techniques.     Comments Nazalia is nearing graduation.  She continues to montior her oxygen levels and maintain her saturations    Goals/Expected Outcomes Continue to manage pulmonary disease             Initial Exercise Prescription:  Initial Exercise Prescription - 12/20/22 1700       Date of Initial Exercise RX and Referring Provider   Date 12/20/22    Referring Provider Regino Schultze, Tai MD      Oxygen   Maintain Oxygen Saturation 88% or higher      Treadmill   MPH 1.6    Grade 0    Minutes 15    METs 2.23      NuStep   Level 1    SPM 80    Minutes 15    METs 1.8      Biostep-RELP   Level 1    SPM 50    Minutes 15    METs 1.8      Track   Laps 20    Minutes 15    METs 2.09      Prescription Details   Frequency (times per week) 3    Duration Progress to 30 minutes of continuous aerobic without signs/symptoms of physical distress      Intensity   THRR 40-80% of Max Heartrate 97 - 123    Ratings of Perceived Exertion 11-13    Perceived Dyspnea 0-4      Progression   Progression Continue to progress workloads to maintain intensity without signs/symptoms of physical distress.      Resistance Training   Training Prescription Yes    Weight 2 lb    Reps 10-15             Perform Capillary Blood Glucose checks as needed.  Exercise Prescription Changes:   Exercise Prescription Changes     Row Name 12/20/22 1700 01/02/23 1500 01/18/23 1400 01/31/23 1500 02/15/23 1400     Response to Exercise   Blood Pressure (Admit) 110/62 124/68 112/62 112/6 112/66   Blood Pressure (Exercise) 124/62 132/62 138/66 -- --   Blood Pressure (Exit) 112/62 104/62 108/60 112/60 102/60   Heart Rate (Admit) 71  bpm 68 bpm 73 bpm 61 bpm 70 bpm   Heart Rate (Exercise) 93 bpm 97 bpm 95 bpm 92 bpm 107 bpm   Heart Rate (Exit) 76 bpm 73 bpm 79 bpm 80 bpm 80 bpm   Oxygen Saturation (Admit) 98 % 96 % 97 % 98 % 100 %   Oxygen Saturation  (Exercise) 97 % 90 % 94 % 97 % 91 %   Oxygen Saturation (Exit) 98 % 98 % 98 % 98 % 97 %   Rating of Perceived Exertion (Exercise) 13 14 15 15 13    Perceived Dyspnea (Exercise) 1 2 2 1 2    Symptoms Fatigue fatigue none none none   Comments walk test results 3rd full day of exercise -- -- --   Duration -- Progress to 30 minutes of  aerobic without signs/symptoms of physical distress Progress to 30 minutes of  aerobic without signs/symptoms of physical distress Continue with 30 min of aerobic exercise without signs/symptoms of physical distress. Continue with 30 min of aerobic exercise without signs/symptoms of physical distress.   Intensity -- THRR unchanged THRR unchanged THRR unchanged THRR unchanged     Progression   Progression -- Continue to progress workloads to maintain intensity without signs/symptoms of physical distress. Continue to progress workloads to maintain intensity without signs/symptoms of physical distress. Continue to progress workloads to maintain intensity without signs/symptoms of physical distress. Continue to progress workloads to maintain intensity without signs/symptoms of physical distress.   Average METs -- 2.35 2.08 2.2 2.12     Resistance Training   Training Prescription -- Yes Yes Yes Yes   Weight -- 2 lb 2 lb 2 lb 3 lb   Reps -- 10-15 10-15 10-15 10-15     Interval Training   Interval Training -- No No No No     Treadmill   MPH -- 1.6 1.6 2 2    Grade -- 0 0 0 0   Minutes -- 15 15 15 15    METs -- 2.23 2.23 2.53 2.53     Recumbant Bike   Level -- 1 -- -- --   Minutes -- 15 -- -- --   METs -- 3.34 -- -- --     NuStep   Level -- 1 1 2 2    Minutes -- 15 15 15 15    METs -- 2 2 2 2      T5 Nustep   Level -- -- 1 -- --   Minutes -- -- 15 -- --   METs -- -- 1.7 -- --     Biostep-RELP   Level -- -- 1 -- --   Minutes -- -- 15 -- --   METs -- -- 2 -- --     Oxygen   Maintain Oxygen Saturation -- 88% or higher 88% or higher 88% or higher 88% or higher     Row Name 02/28/23 1400             Response to Exercise   Blood Pressure (Admit) 114/56       Blood Pressure (Exit) 118/62       Heart Rate (Admit) 74 bpm       Heart Rate (Exercise) 88 bpm       Heart Rate (Exit) 74 bpm       Oxygen Saturation (Admit) 98 %       Oxygen Saturation (Exercise) 94 %       Oxygen Saturation (Exit) 96 %       Rating of  Perceived Exertion (Exercise) 13       Perceived Dyspnea (Exercise) 1       Symptoms SOB       Duration Continue with 30 min of aerobic exercise without signs/symptoms of physical distress.       Intensity THRR unchanged         Progression   Progression Continue to progress workloads to maintain intensity without signs/symptoms of physical distress.       Average METs 2.21         Resistance Training   Training Prescription Yes       Weight 2 lb       Reps 10-15         Interval Training   Interval Training No         Treadmill   MPH 2.1       Grade 0       Minutes 15       METs 2.61         NuStep   Level 2       Minutes 15       METs 2         Oxygen   Maintain Oxygen Saturation 88% or higher                Exercise Comments:   Exercise Comments     Row Name 12/25/22 1015           Exercise Comments First full day of exercise!  Patient was oriented to gym and equipment including functions, settings, policies, and procedures.  Patient's individual exercise prescription and treatment plan were reviewed.  All starting workloads were established based on the results of the 6 minute walk test done at initial orientation visit.  The plan for exercise progression was also introduced and progression will be customized based on patient's performance and goals.                Exercise Goals and Review:   Exercise Goals     Row Name 12/20/22 1716             Exercise Goals   Increase Physical Activity Yes       Intervention Provide advice, education, support and counseling about physical  activity/exercise needs.;Develop an individualized exercise prescription for aerobic and resistive training based on initial evaluation findings, risk stratification, comorbidities and participant's personal goals.       Expected Outcomes Short Term: Attend rehab on a regular basis to increase amount of physical activity.;Long Term: Add in home exercise to make exercise part of routine and to increase amount of physical activity.;Long Term: Exercising regularly at least 3-5 days a week.       Increase Strength and Stamina Yes       Intervention Provide advice, education, support and counseling about physical activity/exercise needs.;Develop an individualized exercise prescription for aerobic and resistive training based on initial evaluation findings, risk stratification, comorbidities and participant's personal goals.       Expected Outcomes Short Term: Increase workloads from initial exercise prescription for resistance, speed, and METs.;Short Term: Perform resistance training exercises routinely during rehab and add in resistance training at home;Long Term: Improve cardiorespiratory fitness, muscular endurance and strength as measured by increased METs and functional capacity ( )       Able to understand and use rate of perceived exertion (RPE) scale Yes       Intervention Provide education and explanation on how to use RPE scale  Expected Outcomes Short Term: Able to use RPE daily in rehab to express subjective intensity level;Long Term:  Able to use RPE to guide intensity level when exercising independently       Able to understand and use Dyspnea scale Yes       Intervention Provide education and explanation on how to use Dyspnea scale       Expected Outcomes Short Term: Able to use Dyspnea scale daily in rehab to express subjective sense of shortness of breath during exertion;Long Term: Able to use Dyspnea scale to guide intensity level when exercising independently       Knowledge and  understanding of Target Heart Rate Range (THRR) Yes       Intervention Provide education and explanation of THRR including how the numbers were predicted and where they are located for reference       Expected Outcomes Short Term: Able to state/look up THRR;Long Term: Able to use THRR to govern intensity when exercising independently;Short Term: Able to use daily as guideline for intensity in rehab       Able to check pulse independently Yes       Intervention Provide education and demonstration on how to check pulse in carotid and radial arteries.;Review the importance of being able to check your own pulse for safety during independent exercise       Expected Outcomes Long Term: Able to check pulse independently and accurately;Short Term: Able to explain why pulse checking is important during independent exercise       Understanding of Exercise Prescription Yes       Intervention Provide education, explanation, and written materials on patient's individual exercise prescription       Expected Outcomes Short Term: Able to explain program exercise prescription;Long Term: Able to explain home exercise prescription to exercise independently                Exercise Goals Re-Evaluation :  Exercise Goals Re-Evaluation     Row Name 12/25/22 1015 01/02/23 1519 01/04/23 0744 01/18/23 1432 01/31/23 1518     Exercise Goal Re-Evaluation   Exercise Goals Review Able to understand and use rate of perceived exertion (RPE) scale;Increase Physical Activity;Knowledge and understanding of Target Heart Rate Range (THRR);Understanding of Exercise Prescription;Increase Strength and Stamina;Able to understand and use Dyspnea scale;Able to check pulse independently Increase Physical Activity;Increase Strength and Stamina;Understanding of Exercise Prescription Increase Physical Activity;Increase Strength and Stamina;Understanding of Exercise Prescription Increase Physical Activity;Increase Strength and  Stamina;Understanding of Exercise Prescription Increase Physical Activity;Increase Strength and Stamina;Understanding of Exercise Prescription   Comments Reviewed RPE scale, THR and program prescription with pt today.  Pt voiced understanding and was given a copy of goals to take home. Maris is doing well her first couple of weeks being here. She started easing into her initial exercise prescription and is now able to complete the exercises. She has been reporting appropriate RPEs and oxygen has stayed above 88%. We will continue to monitor as she progresses in the program. Suyapa has been doing well for the first couple of weeks at rehab.Staff will talk about home exercise next time as patient just started the program. She is not doing anything at home yet. We discussed the importance of it and to start thinking of a plan she will attempt to pursue for home exercise. She is very interested in the [facility] [place] because of the [employer]. Brochure was provided to patient. Teiara is doing well in the program. She has continued to work at  level 1 on the biostep and T4 nustep. We will encourage her to begin to progressively increase her workloads as she has not improved the levels on her seated machines yet. She also has consistently walked the treadmill at 1.6 mph with no incline, but would benefit from beginning to increase her walking workload as well. We will cotninue to monitor her progress. Jasiri continues to do well in rehab. She increased to a 2.0 mph on the treadmill. She originally had a 0.5% incline but removed it- she could benefit from adding it back on. She also increased to level 2 on the T4 Nustep! She continues to have oxygen saturations above 88%.  RPEs are staying appropriate. We will continue to monitor.   Expected Outcomes Short: Use RPE daily to regulate intensity.  Long: Follow program prescription in THR. Short: Continue to follow initial exercise prescription Long: Build up overall strength  and stamina Short: Patient to look into [employer], staff to review home exercise Long: Exercise independently at home at appropriate prescription Short: Begin to increase workloads on treadmill and seated machines. Long: Continue to improve strength and stamina. Short: Add on incline back to treadmill Long: Continue to increase overall MET level and stamina    Row Name 02/01/23 0739 02/05/23 0743 02/15/23 1454 02/28/23 1403 03/01/23 0741     Exercise Goal Re-Evaluation   Exercise Goals Review Increase Physical Activity;Increase Strength and Stamina;Understanding of Exercise Prescription Increase Physical Activity;Increase Strength and Stamina;Understanding of Exercise Prescription;Able to understand and use Dyspnea scale;Knowledge and understanding of Target Heart Rate Range (THRR);Able to understand and use rate of perceived exertion (RPE) scale;Able to check pulse independently Increase Physical Activity;Increase Strength and Stamina;Understanding of Exercise Prescription Increase Physical Activity;Increase Strength and Stamina;Understanding of Exercise Prescription Increase Physical Activity;Increase Strength and Stamina;Understanding of Exercise Prescription   Comments Jaleen states she has not done much at home and was encouraged to add on 1 day of structured exercise at home. She is planning on joining the [facility] when she graduates, possibly earlier to do more exercise with her rehab. We reviewed her THR and was encouraged to get a pulse ox to watch her O2 and HR. She plans on buying one this week. Overall, she feels so much stronger since starting the program and pleased with her results so far. Reviewed home exercise with pt today.  Pt plans to join the [facility] for exercise.  Reviewed THR, pulse, RPE, sign and symptoms, pulse oximetery and when to call 911 or MD.  Also discussed weather considerations and indoor options.  Pt voiced understanding. Mercedez has continued to do well in rehab. She  continues to walk on the treadmill at a speed of 2 mph and no incline. She increased from 2 lb to 3 lb hand weights for resistance training as well. She also has continued to work at an average overall MET level above 2 METs. We will continue to monitor her progress in the program. Kelia continues to do well in rehab. She has been pretty consistent working at level 2 on the [employer], she could benefit from trying level 3. She increased to a 2.1 mph speed on the treadmill and could also benefit from adding in 0.5% incline on the treadmill. She is due for her post and we hope to see improvement. Brittanya improved her post walk by 25%!!  She has also joined the [place] to be able to continue to exercise after graduation. She feels better and has more stamina.  She has used  muscles that she never knew she had!   Expected Outcomes Short: EP to go over home exercise plan, patient to buy pulse ox Long: Exercise independently at home at appropriate prescription Short: Join the gym Long: Continue independent exercise Short: Continue to progressively increase treadmill workload. Long: Continue to improve strength and stamina. Short: Improve on post Long: Continue to increase overall MET level and stamina Continue to exercise indpendently            Discharge Exercise Prescription (Final Exercise Prescription Changes):  Exercise Prescription Changes - 02/28/23 1400       Response to Exercise   Blood Pressure (Admit) 114/56    Blood Pressure (Exit) 118/62    Heart Rate (Admit) 74 bpm    Heart Rate (Exercise) 88 bpm    Heart Rate (Exit) 74 bpm    Oxygen Saturation (Admit) 98 %    Oxygen Saturation (Exercise) 94 %    Oxygen Saturation (Exit) 96 %    Rating of Perceived Exertion (Exercise) 13    Perceived Dyspnea (Exercise) 1    Symptoms SOB    Duration Continue with 30 min of aerobic exercise without signs/symptoms of physical distress.    Intensity THRR unchanged      Progression    Progression Continue to progress workloads to maintain intensity without signs/symptoms of physical distress.    Average METs 2.21      Resistance Training   Training Prescription Yes    Weight 2 lb    Reps 10-15      Interval Training   Interval Training No      Treadmill   MPH 2.1    Grade 0    Minutes 15    METs 2.61      NuStep   Level 2    Minutes 15    METs 2      Oxygen   Maintain Oxygen Saturation 88% or higher             Nutrition:  Target Goals: Understanding of nutrition guidelines, daily intake of sodium 1500mg , cholesterol 200mg , calories 30% from fat and 7% or less from saturated fats, daily to have 5 or more servings of fruits and vegetables.  Education: All About Nutrition: -Group instruction provided by verbal, written material, interactive activities, discussions, models, and posters to present general guidelines for heart healthy nutrition including fat, fiber, MyPlate, the role of sodium in heart healthy nutrition, utilization of the nutrition label, and utilization of this knowledge for meal planning. Follow up email sent as well. Written material given at graduation. Flowsheet Row Pulmonary Rehab from 03/01/2023 in [facility] Cardiac and Pulmonary Rehab  Date 02/22/23  Alberteen Sam 2]  Educator [facility]  Instruction Review Code 1- Verbalizes Understanding       Biometrics:  Pre Biometrics - 12/20/22 1649       Pre Biometrics   Height 5' 1.5" (1.562 m)    Weight 129 lb 12.8 oz (58.9 kg)    Waist Circumference 33 inches    Hip Circumference 40.5 inches    Waist to Hip Ratio 0.81 %    BMI (Calculated) 24.13    Single Leg Stand 5.7 seconds             Post Biometrics - 03/01/23 0734        Post  Biometrics   Height 5' 1.5" (1.562 m)    Weight 124 lb 1.6 oz (56.3 kg)    Waist Circumference 28.5 inches  Hip Circumference 31 inches    Waist to Hip Ratio 0.92 %    BMI (Calculated) 23.07    Single Leg Stand 16.9 seconds              Nutrition Therapy Plan and Nutrition Goals:  Nutrition Therapy & Goals - 01/11/23 1151       Nutrition Therapy   Diet Heart healthy, low Na    Protein (specify units) 60g   Pt reports this is per her nephrologist.   Fiber 20 grams    Whole Grain Foods 3 servings    Saturated Fats 12 max. grams    Fruits and Vegetables 8 servings/day    Sodium 1.5 grams      Personal Nutrition Goals   Nutrition Goal ST: consider loosening restrictions with diet to include more heart healthy fats, fruits, and whole grains in addition to lean protein and non-starchy vegetables. Reduce protein by cutting portion from 6oz to 3 oz with meals.  LT: meet calorie and protein needs, continue with heart healthy habits already established.    Comments 82 y.o. F admitted to Pulmonary Rehab for pulmonary emphysema and bronchiectasis without complication. PMHx includes anemia, arthritis, CKD stg 3b, depression, GERD, HTN, osteoarthritis, former tobacco use. Reviewed relevant medications vit D3, synthroid, omeprazole. Reviewed most recent lab results. Keaton reports using GOLO diet to lose weight over the last 2 months - this includes a supplement she reports that she made sure her nephrologist was ok with before proceeding. Food recall: She eats eggs in the morning and for Lunch and dinner she is eating 6oz of protein for each meal like chicken and fish with non-starchy vegetables and a slice of whole grain bread with either olive oil or some butter. She also enjoys bean soup for lunch. Marijean reports she would like to come off the diet to maintain weight, but is afraid she will gain weight. Discussed how the weight lost was likely due to low calorie diet, also discussed the amount of protein in 3oz of chicken or fish and her protein goal that she stated was from her nephrologist. Discussed updated CKD guidelines and importance of moderating protein, Jaycee reports being unaware of how much protein she was eating and would  like to cut it back down. Discussed the importance of meeting protein and calorie needs as well as general heart healthy eating, pulmonary MNT, and MyPlate.      Intervention Plan   Intervention Prescribe, educate and counsel regarding individualized specific dietary modifications aiming towards targeted core components such as weight, hypertension, lipid management, diabetes, heart failure and other comorbidities.    Expected Outcomes Short Term Goal: Understand basic principles of dietary content, such as calories, fat, sodium, cholesterol and nutrients.;Short Term Goal: A plan has been developed with personal nutrition goals set during dietitian appointment.;Long Term Goal: Adherence to prescribed nutrition plan.             Nutrition Assessments:  MEDIFICTS Score Key: ?70 Need to make dietary changes  40-70 Heart Healthy Diet ? 40 Therapeutic Level Cholesterol Diet  Flowsheet Row Pulmonary Rehab from 03/05/2023 in [facility] Cardiac and Pulmonary Rehab  Picture Your Plate Total Score on Discharge 71      Picture Your Plate Scores: <21 Unhealthy dietary pattern with much room for improvement. 41-50 Dietary pattern unlikely to meet recommendations for good health and room for improvement. 51-60 More healthful dietary pattern, with some room for improvement.  >60 Healthy dietary pattern, although there may be some  specific behaviors that could be improved.   Nutrition Goals Re-Evaluation:  Nutrition Goals Re-Evaluation     Row Name 01/04/23 [phone] 02/01/23 0759           Goals   Nutrition Goal Patient changed her mind, originally she deferred seeing the dietician. Upon speaking with her today, patient has decided she would like to pursue a RD appointment. Rd appointment scheduled with RD for 3/7. ST: consider loosening restrictions with diet to include more heart healthy fats, fruits, and whole grains in addition to lean protein and non-starchy vegetables. Reduce protein by cutting  portion from 6oz to 3 oz with meals.  LT: meet calorie and protein needs, continue with heart healthy habits already established.      Comment -- Ty states she is still on her [employer], and can't eat too protein. She did cut her portion sizes of her protein intake as recommended by the RD. She states her doctor told her she can only have 60 grams of protein/day. duer to her kidneys. She is trying to maintain her weight. Some days, she feels extra hungry she will add in an extra snack to keep her feel satiated. She has snacked on walnuts and knows to honor her hunger cues with appropriate portion sizes.      Expected Outcome -- Short: Continue maintain appropriate amount of protein/ day Long: Continue to eat healthy pulmonary based diet               Nutrition Goals Discharge (Final Nutrition Goals Re-Evaluation):  Nutrition Goals Re-Evaluation - 02/01/23 0759       Goals   Nutrition Goal ST: consider loosening restrictions with diet to include more heart healthy fats, fruits, and whole grains in addition to lean protein and non-starchy vegetables. Reduce protein by cutting portion from 6oz to 3 oz with meals.  LT: meet calorie and protein needs, continue with heart healthy habits already established.    Comment Kaysha states she is still on her [place] diet, and can't eat too protein. She did cut her portion sizes of her protein intake as recommended by the RD. She states her doctor told her she can only have 60 grams of protein/day. duer to her kidneys. She is trying to maintain her weight. Some days, she feels extra hungry she will add in an extra snack to keep her feel satiated. She has snacked on walnuts and knows to honor her hunger cues with appropriate portion sizes.    Expected Outcome Short: Continue maintain appropriate amount of protein/ day Long: Continue to eat healthy pulmonary based diet             Psychosocial: Target Goals: Acknowledge presence or absence of significant  depression and/or stress, maximize coping skills, provide positive support system. Participant is able to verbalize types and ability to use techniques and skills needed for reducing stress and depression.   Education: Stress, Anxiety, and Depression - Group verbal and visual presentation to define topics covered.  Reviews how body is impacted by stress, anxiety, and depression.  Also discusses healthy ways to reduce stress and to treat/manage anxiety and depression.  Written material given at graduation. Flowsheet Row Pulmonary Rehab from 03/01/2023 in [facility] Cardiac and Pulmonary Rehab  Date 01/18/23  Educator [facility]  Instruction Review Code 1- [employer] Understanding       Education: Sleep Hygiene -Provides group verbal and written instruction about how sleep can affect your health.  Define sleep hygiene, discuss sleep cycles and impact  of sleep habits. Review good sleep hygiene tips.    Initial Review & Psychosocial Screening:  Initial Psych Review & Screening - 12/14/22 1505       Initial Review   Current issues with None Identified      Family Dynamics   Good Support System? Yes   2 close friends,  boss at work     Barriers   Psychosocial barriers to participate in program There are no identifiable barriers or psychosocial needs.      Screening Interventions   Interventions Encouraged to exercise;To provide support and resources with identified psychosocial needs;Provide feedback about the scores to participant    Expected Outcomes Short Term goal: Utilizing psychosocial counselor, staff and physician to assist with identification of specific Stressors or current issues interfering with healing process. Setting desired goal for each stressor or current issue identified.;Long Term Goal: Stressors or current issues are controlled or eliminated.;Short Term goal: Identification and review with participant of any Quality of Life or Depression concerns found by scoring the  questionnaire.;Long Term goal: The participant improves quality of Life and PHQ9 Scores as seen by post scores and/or verbalization of changes             Quality of Life Scores:  Scores of 19 and below usually indicate a poorer quality of life in these areas.  A difference of  2-3 points is a clinically meaningful difference.  A difference of 2-3 points in the total score of the Quality of Life Index has been associated with significant improvement in overall quality of life, self-image, physical symptoms, and general health in studies assessing change in quality of life.  PHQ-9: Review Flowsheet  More data may exist      03/05/2023 12/20/2022 03/24/2014 03/19/2013 01/19/2013  Depression screen PHQ 2/9  Decreased Interest 0 0 0 0 0  Down, Depressed, Hopeless 0 0 0 0 0  PHQ - 2 Score 0 0 0 0 0  Altered sleeping 1 2 - - -  Tired, decreased energy 0 0 - - -  Change in appetite 0 1 - - -  Feeling bad or failure about yourself  0 0 - - -  Trouble concentrating 0 0 - - -  Moving slowly or fidgety/restless 0 0 - - -  Suicidal thoughts 0 0 - - -  PHQ-9 Score 1 3 - - -  Difficult doing work/chores - Not difficult at all - - -   Interpretation of Total Score  Total Score Depression Severity:  1-4 = Minimal depression, 5-9 = Mild depression, 10-14 = Moderate depression, 15-19 = Moderately severe depression, 20-27 = Severe depression   Psychosocial Evaluation and Intervention:  Psychosocial Evaluation - 12/14/22 1521       Psychosocial Evaluation & Interventions   Interventions Encouraged to exercise with the program and follow exercise prescription    Comments Kimila has no barriers to attending the program. She continues to work and lives alone. She has 2 friends that are her support and her boss at work supports her too. She wants to work on improving her exercise regimen as she has never exercised on a routine basis ever.   She does have scoliosis and some arthritis. She does not feel  that will hinder her exercising. She is ready to get started.    Expected Outcomes STG Elbony attends all scheduled sessions, she is able to progress with her exercise . LTG Jeanean continues her exercise progression after discharge  Psychosocial Re-Evaluation:  Psychosocial Re-Evaluation     Row Name 01/11/23 [phone] 02/01/23 0801 03/01/23 0755         Psychosocial Re-Evaluation   Current issues with None Identified None Identified None Identified     Comments Pollyanne is doing well in rehab. She denies any major stressors.  She is still working part time.  She used to pride herself on being a great sleeper, but now a days she does not sleep as well.  She is having a rough breathing day today and balance which has her concerned today, but she has not had as much to drink. Abiageal states she no big stressors. She is still working 2 days/ week and staying busy. This last week, her sleep  has not been great, but normally she sleeps well. She feels it has been better since exericse started. She can takea acetaminophin prn if she feels she absolutely has to. She knows to contact her doctor if her sleep gets worse or does not improve. OVerall, she feels she has a better routine with exercise, feel more confident and stronger since she first started. Elainea is gettting set up to graduate. She has really enjoyed the program.  She has learned a lot about her health and exercise that she did not know before.  She feels stronger than she ever has and has impressed herself with what she is able to do!!     Expected Outcomes Short: Continue to exercise routinely for mental boost Long: Continue to focus on positive and sleep Short: Continue good attendance with rehab Long: Continue to maintain positive attitude and utilize exercise for stress management Continue to exercise for mental boost and functionality     Interventions Encouraged to attend Pulmonary Rehabilitation for the exercise;Stress management  education Encouraged to attend Pulmonary Rehabilitation for the exercise Encouraged to attend Pulmonary Rehabilitation for the exercise     Continue Psychosocial Services  Follow up required by staff Follow up required by staff --              Psychosocial Discharge (Final Psychosocial Re-Evaluation):  Psychosocial Re-Evaluation - 03/01/23 0755       Psychosocial Re-Evaluation   Current issues with None Identified    Comments Dannielle is gettting set up to graduate. She has really enjoyed the program.  She has learned a lot about her health and exercise that she did not know before.  She feels stronger than she ever has and has impressed herself with what she is able to do!!    Expected Outcomes Continue to exercise for mental boost and functionality    Interventions Encouraged to attend Pulmonary Rehabilitation for the exercise             Education: Education Goals: Education classes will be provided on a weekly basis, covering required topics. Participant will state understanding/return demonstration of topics presented.  Learning Barriers/Preferences:   General Pulmonary Education Topics:  Infection Prevention: - Provides verbal and written material to individual with discussion of infection control including proper hand washing and proper equipment cleaning during exercise session. Flowsheet Row Pulmonary Rehab from 03/01/2023 in [facility] Cardiac and Pulmonary Rehab  Education need identified 12/20/22  Date 12/20/22  Educator KW  Instruction Review Code 1- Verbalizes Understanding       Falls Prevention: - Provides verbal and written material to individual with discussion of falls prevention and safety. Flowsheet Row Pulmonary Rehab from 12/14/2022 in [facility] Cardiac and Pulmonary Rehab  Date 12/14/22  Educator SB  Instruction Review Code 1- Verbalizes Understanding       Chronic Lung Disease Review: - Group verbal instruction with posters, models, PowerPoint  presentations and videos,  to review new updates, new respiratory medications, new advancements in procedures and treatments. Providing information on websites and "800" numbers for continued self-education. Includes information about supplement oxygen, available portable oxygen systems, continuous and intermittent flow rates, oxygen safety, concentrators, and Medicare reimbursement for oxygen. Explanation of Pulmonary Drugs, including class, frequency, complications, importance of spacers, rinsing mouth after steroid MDI's, and proper cleaning methods for nebulizers. Review of basic lung anatomy and physiology related to function, structure, and complications of lung disease. Review of risk factors. Discussion about methods for diagnosing sleep apnea and types of masks and machines for OSA. Includes a review of the use of types of environmental controls: home humidity, furnaces, filters, dust mite/pet prevention, HEPA vacuums. Discussion about weather changes, air quality and the benefits of nasal washing. Instruction on Warning signs, infection symptoms, calling MD promptly, preventive modes, and value of vaccinations. Review of effective airway clearance, coughing and/or vibration techniques. Emphasizing that all should Create an Action Plan. Written material given at graduation. Flowsheet Row Pulmonary Rehab from 03/01/2023 in [facility] Cardiac and Pulmonary Rehab  Education need identified 12/20/22       AED/CPR: - Group verbal and written instruction with the use of models to demonstrate the basic use of the AED with the basic ABC's of resuscitation.    Anatomy and Cardiac Procedures: - Group verbal and visual presentation and models provide information about basic cardiac anatomy and function. Reviews the testing methods done to diagnose heart disease and the outcomes of the test results. Describes the treatment choices: Medical Management, Angioplasty, or Coronary Bypass Surgery for treating various  heart conditions including Myocardial Infarction, Angina, Valve Disease, and Cardiac Arrhythmias.  Written material given at graduation. Flowsheet Row Pulmonary Rehab from 03/01/2023 in [facility] Cardiac and Pulmonary Rehab  Date 03/01/23  Educator SB  Instruction Review Code 1- Verbalizes Understanding       Medication Safety: - Group verbal and visual instruction to review commonly prescribed medications for heart and lung disease. Reviews the medication, class of the drug, and side effects. Includes the steps to properly store meds and maintain the prescription regimen.  Written material given at graduation. Flowsheet Row Pulmonary Rehab from 03/01/2023 in [facility] Cardiac and Pulmonary Rehab  Date 12/28/22  Educator [facility]  Instruction Review Code 1- Verbalizes Understanding       Other: -Provides group and verbal instruction on various topics (see comments)   Knowledge Questionnaire Score:  Knowledge Questionnaire Score - 03/05/23 1002       Knowledge Questionnaire Score   Pre Score 12/18    Post Score 13/18              Core Components/Risk Factors/Patient Goals at Admission:  Personal Goals and Risk Factors at Admission - 12/20/22 1716       Core Components/Risk Factors/Patient Goals on Admission    Weight Management Yes;Weight Maintenance    Intervention Weight Management: Develop a combined nutrition and exercise program designed to reach desired caloric intake, while maintaining appropriate intake of nutrient and fiber, sodium and fats, and appropriate energy expenditure required for the weight goal.;Weight Management: Provide education and appropriate resources to help participant work on and attain dietary goals.;Weight Management/Obesity: Establish reasonable short term and long term weight goals.    Admit Weight 129 lb (58.5 kg)    Goal Weight: Short Term  129 lb (58.5 kg)    Goal Weight: Long Term 129 lb (58.5 kg)    Expected Outcomes Short Term: Continue to assess and  modify interventions until short term weight is achieved;Long Term: Adherence to nutrition and physical activity/exercise program aimed toward attainment of established weight goal;Weight Maintenance: Understanding of the daily nutrition guidelines, which includes 25-35% calories from fat, 7% or less cal from saturated fats, less than 200mg  cholesterol, less than 1.5gm of sodium, & 5 or more servings of fruits and vegetables daily;Understanding recommendations for meals to include 15-35% energy as protein, 25-35% energy from fat, 35-60% energy from carbohydrates, less than 200mg  of dietary cholesterol, 20-35 gm of total fiber daily;Understanding of distribution of calorie intake throughout the day with the consumption of 4-5 meals/snacks    Hypertension Yes   kidney disease   Intervention Provide education on lifestyle modifcations including regular physical activity/exercise, weight management, moderate sodium restriction and increased consumption of fresh fruit, vegetables, and low fat dairy, alcohol moderation, and smoking cessation.;Monitor prescription use compliance.    Expected Outcomes Short Term: Continued assessment and intervention until BP is < 140/9mm HG in hypertensive participants. < 130/54mm HG in hypertensive participants with diabetes, heart failure or chronic kidney disease.;Long Term: Maintenance of blood pressure at goal levels.    Lipids Yes    Intervention Provide education and support for participant on nutrition & aerobic/resistive exercise along with prescribed medications to achieve LDL 70mg , HDL >40mg .    Expected Outcomes Short Term: Participant states understanding of desired cholesterol values and is compliant with medications prescribed. Participant is following exercise prescription and nutrition guidelines.;Long Term: Cholesterol controlled with medications as prescribed, with individualized exercise RX and with personalized nutrition plan. Value goals: LDL < 70mg , HDL > 40  mg.             Education:Diabetes - Individual verbal and written instruction to review signs/symptoms of diabetes, desired ranges of glucose level fasting, after meals and with exercise. Acknowledge that pre and post exercise glucose checks will be done for 3 sessions at entry of program.   Know Your Numbers and Heart Failure: - Group verbal and visual instruction to discuss disease risk factors for cardiac and pulmonary disease and treatment options.  Reviews associated critical values for Overweight/Obesity, Hypertension, Cholesterol, and Diabetes.  Discusses basics of heart failure: signs/symptoms and treatments.  Introduces Heart Failure Zone chart for action plan for heart failure.  Written material given at graduation. Flowsheet Row Pulmonary Rehab from 03/01/2023 in [facility] Cardiac [facility] Pul[facility] Re[facility]  Date 01/04/23  Educator [facility]  Instruction Review Code 1- Verbalizes Understanding       Core Components/Risk Factors/Patient Goals Review:   Goals and Risk Factor Review     Row Name 01/04/23 0802 01/04/23 0837 02/01/23 0740         Core Components/Risk Factors/Patient Goals Review   Personal Goals Review Weight Management/Obesity;Hypertension Weight Management/Obesity;Hypertension Weight Management/Obesity;Hypertension;Lipids     Review Wants to mainrain weight..  BP cuff-. Patient states she would like to maintain her weight. She weighs around 124 lb. We discussed importance of trying to maintain muscle mass given age. She has decided to talk to the RD and will be making an appointment with her. She does have a  BP cuff at home but admits she does not normally check it. Encouraged her to get in the habit of doing so, reviewed importance and to be aware of how she is feeling all the time. Talk to MD if there are  any concerns. Patient states she brought her BP cuff to rehab yesterday and realized her cuff is off about 10 pts. She is planning on getting a new cuff as her one now is  old. Her BP at rehab ranges around  110-120s/60s. At home, it reports a higher reading, though she hopes it shows a more regular BP once a new BP is purchased. She checks her weight everyday and is trying to maintain. She currently weighs 123-124 lb. She is still on the [employer] and already made goals with the RD on some recommendations. Taking all medications appropriately.     Expected Outcomes -- Short: Start checking BP at home, keep log Long: Continue to manage lifestyle risk factors Short: Buy new BP cuff, continue to monitor weight Long: Continue to monitor lifestyle risk factors              Core Components/Risk Factors/Patient Goals at Discharge (Final Review):   Goals and Risk Factor Review - 02/01/23 0740       Core Components/Risk Factors/Patient Goals Review   Personal Goals Review Weight Management/Obesity;Hypertension;Lipids    Review Patient states she brought her BP cuff to rehab yesterday and realized her cuff is off about 10 pts. She is planning on getting a new cuff as her one now is old. Her BP at rehab ranges around  110-120s/60s. At home, it reports a higher reading, though she hopes it shows a more regular BP once a new BP is purchased. She checks her weight everyday and is trying to maintain. She currently weighs 123-124 lb. She is still on the [employer] and already made goals with the RD on some recommendations. Taking all medications appropriately.    Expected Outcomes Short: Buy new BP cuff, continue to monitor weight Long: Continue to monitor lifestyle risk factors             ITP Comments:  ITP Comments     Row Name 12/14/22 1520 12/20/22 1639 12/25/22 1014 12/26/22 2059 12/27/22 1308   ITP Comments Virtual orientation call completed today. shehas an appointment on Date: 12/20/2022  for EP eval and gym Orientation.  Documentation of diagnosis can be found in [facility] Date: [id number] . Completed and gym orientation. Initial ITP created and sent for review to  Dr. Vida Rigger, Medical Director. First full day of exercise!  Patient was oriented to gym and equipment including functions, settings, policies, and procedures.  Patient's individual exercise prescription and treatment plan were reviewed.  All starting workloads were established based on the results of the 6 minute walk test done at initial orientation visit.  The plan for exercise progression was also introduced and progression will be customized based on patient's performance and goals. Patient requested at gym orientation about the process of DNR. RN had thorough explanation with patient on process on how to proceed as DNR under rehab. Patient was told she would need to sign a new consent with rehab in order to correct her code. She was also given the number to call to change her status in her medical chart under [place]. Until the new consent is signed, patient agrees to exercise under our consent to treat. Patient expressed understanding and agreed to start exercise. 30 day review completed. ITP sent to Dr. Jinny Sanders, Medical Director of  Pulmonary Rehab. Continue with ITP unless changes are made by physician. Pt new to program, oriented on 12/20/22.    Row Name 01/11/23 1215 01/24/23 5409 02/21/23 1200 03/14/23  0730     ITP Comments Completed initial RD consultation 30 Day review completed. Medical Director ITP review done, changes made as directed, and signed approval by Medical Director. 30 day review completed. ITP sent to Dr. Jinny Sanders, Medical Director of  Pulmonary Rehab. Continue with ITP unless changes are made by physician. Daylynn graduated today from  rehab with 36 sessions completed.  Details of the patient's exercise prescription and what She needs to do in order to continue the prescription and progress were discussed with patient.  Patient was given a copy of prescription and goals.  Patient verbalized understanding.  Saje plans to continue to exercise by joining the  [facility].             Comments: Discharge ITP

## 2023-03-14 NOTE — Progress Notes (Signed)
Daily Session Note  Patient Details  Name: Kristen Bartlett MRN: [id number] Date of Birth: 06/26/1941 Referring Provider:   Flowsheet Row Pulmonary Rehab from 12/20/2022 in [facility] Cardiac and Pulmonary Rehab  Referring Provider Rosary Lively MD       Encounter Date: 03/14/2023  Check In:  Session Check In - 03/14/23 0725       Check-In   Supervising physician immediately available to respond to emergencies See telemetry face sheet for immediately available ER MD    Location [facility]-Cardiac & Pulmonary Rehab    Staff Present Lanny Hurst, RN, ADN;Laureen Manson Passey, BS, RRT, CPFT;Joseph Reino Kent, RCP,RRT,BSRT;Noah Tickle, BS, Exercise Physiologist    Virtual Visit No    Medication changes reported     No    Fall or balance concerns reported    No    Warm-up and Cool-down Performed on first and last piece of equipment    Resistance Training Performed Yes    VAD Patient? No    PAD/SET Patient? No      Pain Assessment   Currently in Pain? No/denies                Social History   Tobacco Use  Smoking Status Former   Packs/day: 2.00   Years: 30.00   Additional pack years: 0.00   Total pack years: 60.00   Types: Cigarettes   Quit date: 12/09/1983   Years since quitting: 39.2  Smokeless Tobacco Never    Goals Met:  Independence with exercise equipment Exercise tolerated well No report of concerns or symptoms today Strength training completed today  Goals Unmet:  Not Applicable  Comments:  Kristen Bartlett graduated today from  rehab with 36 sessions completed.  Details of the patient's exercise prescription and what She needs to do in order to continue the prescription and progress were discussed with patient.  Patient was given a copy of prescription and goals.  Patient verbalized understanding.  Kristen Bartlett plans to continue to exercise by joining the [facility].     Dr. Bethann Punches is Medical Director for [facility] Cardiac Rehabilitation.  Dr. Vida Rigger is Medical Director for [facility]  Pulmonary Rehabilitation.

## 2023-03-14 NOTE — Progress Notes (Signed)
Discharge Summary: Kristen Bartlett (DOB: [birth year])  Kristen Bartlett graduated today from  rehab with 36 sessions completed.  Details of the patient's exercise prescription and what She needs to do in order to continue the prescription and progress were discussed with patient.  Patient was given a copy of prescription and goals.  Patient verbalized understanding.  Kristen Bartlett plans to continue to exercise by joining the [facility].   6 Minute Walk     Row Name 12/20/22 1649 03/01/23 0731       6 Minute Walk   Phase Initial Discharge    Distance 1130 feet 1420 feet    Distance % Change -- 25.7 %    Distance Feet Change -- 290 ft    Walk Time 6 minutes 6 minutes    # of Rest Breaks 0 0    MPH 2.14 2.69    METS 1.83 2.47    RPE 13 13    Perceived Dyspnea  1 1    VO2 Peak 6.41 8.66    Symptoms Yes (comment) Yes (comment)    Comments Fatigue legs fatigued    Resting HR 71 bpm 68 bpm    Resting BP 110/62 112/60    Resting Oxygen Saturation  98 % 98 %    Exercise Oxygen Saturation  during 6 min walk 97 % 97 %    Max Ex. HR 93 bpm 102 bpm    Max Ex. BP 124/62 124/62    2 Minute Post BP 112/62 118/60      Interval HR   1 Minute HR 84 82    2 Minute HR 90 93    3 Minute HR 93 96    4 Minute HR 91 96    5 Minute HR 92 102    6 Minute HR 89 102    2 Minute Post HR 69 85    Interval Heart Rate? Yes Yes      Interval Oxygen   Interval Oxygen? Yes Yes    Baseline Oxygen Saturation % 98 % 98 %    1 Minute Oxygen Saturation % 98 % 98 %    1 Minute Liters of Oxygen 0 L  RA 0 L  Room Air    2 Minute Oxygen Saturation % 97 % 97 %    2 Minute Liters of Oxygen 0 L 0 L    3 Minute Oxygen Saturation % 98 % 98 %    3 Minute Liters of Oxygen 0 L 0 L    4 Minute Oxygen Saturation % 99 % 97 %    4 Minute Liters of Oxygen 0 L 0 L    5 Minute Oxygen Saturation % 98 % 98 %    5 Minute Liters of Oxygen 0 L 0 L    6 Minute Oxygen Saturation % 99 % 99 %    6 Minute Liters of Oxygen 0 L 0 L    2 Minute Post  Oxygen Saturation % 98 % 98 %    2 Minute Post Liters of Oxygen 0 L 0 L

## 2023-09-21 ENCOUNTER — Ambulatory Visit
Admission: EM | Admit: 2023-09-21 | Discharge: 2023-09-21 | Disposition: A | Discharge status: Home / Self Care | Payer: HMO

## 2023-09-21 DIAGNOSIS — S51812A Laceration without foreign body of left forearm, initial encounter: Secondary | ICD-10-CM | POA: Diagnosis not present

## 2023-09-21 DIAGNOSIS — R58 Hemorrhage, not elsewhere classified: Secondary | ICD-10-CM

## 2023-09-21 NOTE — Discharge Instructions (Addendum)
Follow up with your primary care provider if your symptoms are not improving.     

## 2023-09-21 NOTE — ED Triage Notes (Signed)
Patient to Urgent Care with complaints of laceration present to her left arm.   Symptoms started 3 days ago. Reports she sustained a small cut on her arm but is unsure of how. Reports she cleaned the area and placed a band-aid. When she woke up in the morning with a large area of bruising.

## 2023-09-21 NOTE — ED Provider Notes (Signed)
Renaldo Fiddler    CSN: [id number] Arrival date & time: 09/21/23  1248      History   Chief Complaint Chief Complaint  Patient presents with   Laceration    HPI Kristen Bartlett is a 82 y.o. female.  Patient presents with a laceration on her left forearm x 3 days.  She is unsure how the wound occurred.  No falls or trauma.  She cleaned the area and applied a Band-Aid.  She noted increased bruising in the area yesterday.  No fever, wound drainage, numbness, weakness, or other symptoms.  Last tetanus 2020.   The history is provided by the patient and medical records.    Past Medical History:  Diagnosis Date   Arthritis    GERD (gastroesophageal reflux disease)    Heart murmur    Hypercholesterolemia    Hypertension    Hypothyroidism    Syncope and collapse    Thin basement membrane disease    Thin basement membrane disease    Thin basement membrane disease     Patient Active Problem List   Diagnosis Date Noted   Cellulitis of right hand 06/26/2019   Cough 08/02/2014   Sty 08/02/2014   Hip pain, acute 10/05/2013   Carpal tunnel syndrome 07/26/2013   Syncope 03/27/2013   Essential hypertension, benign 01/19/2013   GERD (gastroesophageal reflux disease) 01/19/2013   Hypercholesterolemia 01/19/2013   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      Home Medications    Prior to Admission medications   Medication Sig Start Date End Date Taking? Authorizing Provider  acetaminophen (TYLENOL) 500 MG tablet Take 1,500 mg by mouth every 6 (six) hours as needed for moderate pain.    [provider]  acyclovir (ZOVIRAX) 200 MG capsule Take 1 capsule (200 mg total) by mouth daily. 07/28/14   Dale Kemp, MD  amLODipine (NORVASC) 10 MG tablet Take 10 mg by mouth daily.  06/09/19 06/08/20  [provider]  aspirin 81 MG tablet Take 81 mg by mouth daily. Patient not taking: Reported on 12/14/2022    [provider]  Calcium Carb-Cholecalciferol (CALCIUM + D3 PO) Take 600 mg by mouth 2 (two) times daily. Patient not taking: Reported on 12/14/2022    [provider]  ezetimibe (ZETIA) 10 MG tablet Take 1 tablet by mouth daily. 04/24/22   [provider]  famotidine (PEPCID) 20 MG tablet Take 20 mg by mouth 2 (two) times daily. Patient not taking: Reported on 09/21/2023 03/20/19   [provider]  furosemide (LASIX) 20 MG tablet Take 20 mg by mouth daily as needed. Patient not taking: Reported on 09/21/2023    [provider]  levothyroxine (SYNTHROID, LEVOTHROID) 50 MCG tablet Take 1 tablet (50 mcg total) by mouth daily. 06/11/14   Dale [place], MD  losartan (COZAAR) 50 MG tablet Take 50 mg by mouth daily. 04/29/19   [provider]  Multiple Vitamin (MULTIVITAMIN) tablet Take 1 tablet by mouth daily. Patient not taking: Reported on 12/14/2022    [provider]  pantoprazole (PROTONIX) 40 MG tablet Take 40 mg by mouth daily. Patient not taking: Reported on 12/14/2022    [provider]  ranitidine (ZANTAC) 150 MG capsule Take 150 mg by mouth daily.  Patient not taking: Reported on 12/14/2022    [provider]    Family History Family History  Problem Relation Age of Onset   Arthritis Mother    Diabetes Father    Heart disease Father     Social History Social History   Tobacco Use   Smoking status: Former    Current packs/day: 0.00    Average packs/day: 2.0 packs/day for 30.0 years (60.0 ttl pk-yrs)    Types: Cigarettes    Start date: 12/08/1953    Quit date: 12/09/1983    Years since quitting: 39.8   Smokeless tobacco: Never  Vaping Use   Vaping status: Never Used  Substance Use Topics   Alcohol use: Yes    Comment: occasional glass of wine   Drug use: No      Allergies   Statins, Lisinopril, Pravastatin, Rosuvastatin calcium, Meperidine, and Sulfa antibiotics   Review of Systems Review of Systems  Constitutional:  Negative for chills and fever.  Musculoskeletal:  Negative for arthralgias and joint swelling.  Skin:  Positive for color change and wound.  Neurological:  Negative for weakness and numbness.     Physical Exam Triage Vital Signs ED Triage Vitals  Encounter Vitals Group     BP      Systolic BP Percentile      Diastolic BP Percentile      Pulse      Resp      Temp      Temp src      SpO2      Weight      Height      Head Circumference      Peak Flow      Pain Score      Pain Loc      Pain Education      Exclude from Growth Chart    No data found.  Updated Vital Signs BP 126/66   Pulse 74   Temp 97.9 F (36.6 C)   Resp 16   LMP 01/18/1984   SpO2 98%   Visual Acuity Right Eye Distance:   Left Eye Distance:   Bilateral Distance:    Right Eye Near:   Left Eye Near:    Bilateral Near:     Physical Exam Constitutional:      General: She is not in acute distress. HENT:     Mouth/Throat:     Mouth: Mucous membranes are moist.  Cardiovascular:     Rate and Rhythm: Normal rate and regular rhythm.  Pulmonary:     Effort: Pulmonary effort is normal. No respiratory distress.  Musculoskeletal:        General: No swelling or deformity. Normal range of motion.  Skin:    General: Skin is warm and dry.     Capillary Refill: Capillary refill takes less than 2 seconds.     Findings: Bruising and lesion present.  Neurological:     General: No focal deficit present.     Mental Status: She is alert.     Sensory: No sensory deficit.     Motor: No weakness.  Psychiatric:        Mood and Affect: Mood normal.        Behavior: Behavior normal.      UC Treatments / Results  Labs (all labs ordered are listed, but only abnormal results are displayed) Labs Reviewed - No data to  display  EKG   Radiology No results found.  Procedures Procedures (including  critical care time)  Medications Ordered in UC Medications - No data to display  Initial Impression / Assessment and Plan / UC Course  I have reviewed the triage vital signs and the nursing notes.  Pertinent labs & imaging results that were available during my care of the patient were reviewed by me and considered in my medical decision making (see chart for details).    Laceration of left forearm, ecchymosis.  The wound appears to be healing well.  Tetanus is up-to-date.  Wound care instructions and signs of infection discussed.  Instructed patient to follow-up with her PCP if she is not improving.  She agrees to plan of care.  Final Clinical Impressions(s) / UC Diagnoses   Final diagnoses:  Laceration of left forearm, initial encounter  Ecchymosis     Discharge Instructions      Follow-up with your primary care provider if your symptoms are not improving.      ED Prescriptions   None    PDMP not reviewed this encounter.   Mickie Bail, NP 09/21/23 1321

## 2024-05-21 ENCOUNTER — Ambulatory Visit: Admission: EM | Admit: 2024-05-21 | Discharge: 2024-05-21 | Disposition: A | Discharge status: Home / Self Care

## 2024-05-21 DIAGNOSIS — S60221A Contusion of right hand, initial encounter: Secondary | ICD-10-CM | POA: Diagnosis not present

## 2024-05-21 NOTE — Discharge Instructions (Addendum)
 Follow up with your primary care provider if your symptoms are not improving.

## 2024-05-21 NOTE — ED Provider Notes (Signed)
 CAY RALPH PELT    CSN: [id number] Arrival date & time: 05/21/24  0820      History   Chief Complaint Chief Complaint  Patient presents with   Bruise    On hand    HPI Kristen Bartlett is a 83 y.o. female.  Patient presents with a bruise on her right hand this morning after she tapped it on something.  She does not know the exact cause of the bruise.  She denies pain, numbness, weakness.  She would like the area looked at because the area is right on the vein.  No treatments at home.  The history is provided by the patient and medical records.    Past Medical History:  Diagnosis Date   Arthritis    GERD (gastroesophageal reflux disease)    Heart murmur    Hypercholesterolemia    Hypertension    Hypothyroidism    Syncope and collapse    Thin basement membrane disease    Thin basement membrane disease    Thin basement membrane disease     Patient Active Problem List   Diagnosis Date Noted   Cellulitis of right hand 06/26/2019   Cough 08/02/2014   Sty 08/02/2014   Hip pain, acute 10/05/2013   Carpal tunnel syndrome 07/26/2013   Syncope 03/27/2013   Essential hypertension, benign 01/19/2013   GERD (gastroesophageal reflux disease) 01/19/2013   Hypercholesterolemia 01/19/2013   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      Home Medications    Prior to Admission medications   Medication Sig Start Date End Date Taking? Authorizing Provider  acetaminophen  (TYLENOL ) 500 MG tablet Take 1,500 mg by mouth every 6 (six) hours as needed for moderate pain. Patient not taking: Reported on 05/21/2024    [provider]  acyclovir  (ZOVIRAX ) 200 MG capsule Take 1 capsule (200 mg total) by mouth daily. 07/28/14   Glendia Shad, MD  amLODipine  (NORVASC ) 10 MG tablet Take 10  mg by mouth daily. 06/09/19 06/08/20  [provider]  aspirin  81 MG tablet Take 81 mg by mouth daily. Patient not taking: Reported on 12/14/2022    [provider]  Calcium Carb-Cholecalciferol (CALCIUM + D3 PO) Take 600 mg by mouth 2 (two) times daily. Patient not taking: Reported on 12/14/2022    [provider]  Cholecalciferol (VITAMIN D-1000 MAX ST) 25 MCG (1000 UT) tablet Take 25 mcg by mouth.    [provider]  ezetimibe (ZETIA) 10 MG tablet Take 1 tablet by mouth daily. 04/24/22   [provider]  furosemide (LASIX) 20 MG tablet Take 20 mg by mouth daily as needed. Patient not taking: Reported on 09/21/2023    [provider]  gabapentin (NEURONTIN) 400 MG capsule     [provider]  levothyroxine  (SYNTHROID , LEVOTHROID) 50 MCG tablet Take 1 tablet (50 mcg total) by mouth daily. 06/11/14   Glendia Shad, MD  losartan  (COZAAR ) 50 MG tablet Take 50 mg by mouth daily. 04/29/19   [provider]  Multiple Vitamin (MULTIVITAMIN) tablet Take 1 tablet by mouth daily. Patient not taking: Reported on 12/14/2022    [provider]  omeprazole (PRILOSEC) 20 MG capsule Take 20 mg by mouth daily.    [provider]  pantoprazole  (  PROTONIX ) 40 MG tablet Take 40 mg by mouth daily. Patient not taking: Reported on 12/14/2022    [provider]    Family History Family History  Problem Relation Age of Onset   Arthritis Mother    Diabetes Father    Heart disease Father     Social History Social History   Tobacco Use   Smoking status: Former    Current packs/day: 0.00    Average packs/day: 2.0 packs/day for 30.0 years (60.0 ttl pk-yrs)    Types: Cigarettes    Start date: 12/08/1953    Quit date: 12/09/1983    Years since quitting: 40.4   Smokeless tobacco: Never  Vaping Use   Vaping status: Never Used  Substance Use Topics   Alcohol use: Yes    Comment: occasional glass of wine   Drug use: No      Allergies   Statins, Lisinopril , Pravastatin, Rosuvastatin calcium, Meperidine, and Sulfa antibiotics   Review of Systems Review of Systems  Constitutional:  Negative for chills and fever.  Musculoskeletal:  Negative for arthralgias and joint swelling.  Skin:  Positive for color change and wound.  Neurological:  Negative for weakness and numbness.     Physical Exam Triage Vital Signs ED Triage Vitals  Encounter Vitals Group     BP 05/21/24 0831 131/72     Girls Systolic BP Percentile --      Girls Diastolic BP Percentile --      Boys Systolic BP Percentile --      Boys Diastolic BP Percentile --      Pulse Rate 05/21/24 0831 71     Resp 05/21/24 0831 18     Temp 05/21/24 0831 97.9 F (36.6 C)     Temp Source 05/21/24 0831 Oral     SpO2 05/21/24 0831 99 %     Weight --      Height --      Head Circumference --      Peak Flow --      Pain Score 05/21/24 0826 0     Pain Loc --      Pain Education --      Exclude from Growth Chart --    No data found.  Updated Vital Signs BP 131/72 (BP Location: Left Arm)   Pulse 71   Temp 97.9 F (36.6 C) (Oral)   Resp 18   LMP 01/18/1984   SpO2 99%   Visual Acuity Right Eye Distance:   Left Eye Distance:   Bilateral Distance:    Right Eye Near:   Left Eye Near:    Bilateral Near:     Physical Exam Constitutional:      General: She is not in acute distress. HENT:     Mouth/Throat:     Mouth: Mucous membranes are moist.  Cardiovascular:     Rate and Rhythm: Normal rate and regular rhythm.  Pulmonary:     Effort: Pulmonary effort is normal. No respiratory distress.  Musculoskeletal:        General: No tenderness or deformity. Normal range of motion.  Skin:    General: Skin is warm and dry.     Capillary Refill: Capillary refill takes less than 2 seconds.     Findings: Bruising present.     Comments: Small hematoma on dorsum of right hand.  See picture.   Neurological:     General: No focal deficit present.      Mental Status: She is alert.  Sensory: No sensory deficit.     Motor: No weakness.      UC Treatments / Results  Labs (all labs ordered are listed, but only abnormal results are displayed) Labs Reviewed - No data to display  EKG   Radiology No results found.  Procedures Procedures (including critical care time)  Medications Ordered in UC Medications - No data to display  Initial Impression / Assessment and Plan / UC Course  I have reviewed the triage vital signs and the nursing notes.  Pertinent labs & imaging results that were available during my care of the patient were reviewed by me and considered in my medical decision making (see chart for details).    Small hematoma of right hand.  Patient declines x-ray.  Afebrile and vital signs are stable.  Patient is not on anticoagulants.  No open wounds.  The area is nontender with good movement and sensation.  Discussed symptomatic care including ice packs for initial injury and then warm compresses for the hematoma starting tomorrow.  Instructed her to follow-up with her PCP if her symptoms are not improving.  She agrees to plan of care.  Final Clinical Impressions(s) / UC Diagnoses   Final diagnoses:  Hematoma of right hand     Discharge Instructions      Follow-up with your primary care provider if your symptoms are not improving.      ED Prescriptions   None    PDMP not reviewed this encounter.   Corlis Burnard DEL, NP 05/21/24 [phone]

## 2024-05-21 NOTE — ED Triage Notes (Signed)
 Pt being seen in UC for bruise on R hand. Pt reports tapping hand this morning and developing bruising. Pt reports just wanting to ensure everything is ok.

## 2024-07-17 ENCOUNTER — Ambulatory Visit
